# Patient Record
Sex: Female | Born: 1940 | Race: White | Hispanic: No | Marital: Married | Smoking: Never smoker
Health system: Southern US, Community
[De-identification: ages and names within clinical notes are randomized; demographics above are authoritative.]

## PROBLEM LIST (undated history)

## (undated) DIAGNOSIS — E039 Hypothyroidism, unspecified: Secondary | ICD-10-CM

## (undated) DIAGNOSIS — C801 Malignant (primary) neoplasm, unspecified: Secondary | ICD-10-CM

## (undated) DIAGNOSIS — F419 Anxiety disorder, unspecified: Secondary | ICD-10-CM

## (undated) DIAGNOSIS — M1990 Unspecified osteoarthritis, unspecified site: Secondary | ICD-10-CM

## (undated) DIAGNOSIS — Z5189 Encounter for other specified aftercare: Secondary | ICD-10-CM

## (undated) DIAGNOSIS — T7840XA Allergy, unspecified, initial encounter: Secondary | ICD-10-CM

## (undated) DIAGNOSIS — E785 Hyperlipidemia, unspecified: Secondary | ICD-10-CM

## (undated) DIAGNOSIS — D649 Anemia, unspecified: Secondary | ICD-10-CM

## (undated) DIAGNOSIS — K219 Gastro-esophageal reflux disease without esophagitis: Secondary | ICD-10-CM

## (undated) DIAGNOSIS — H269 Unspecified cataract: Secondary | ICD-10-CM

## (undated) HISTORY — DX: Allergy, unspecified, initial encounter: T78.40XA

## (undated) HISTORY — DX: Unspecified osteoarthritis, unspecified site: M19.90

## (undated) HISTORY — PX: JOINT REPLACEMENT: SHX530

## (undated) HISTORY — PX: COLONOSCOPY: SHX174

## (undated) HISTORY — DX: Anemia, unspecified: D64.9

## (undated) HISTORY — DX: Unspecified cataract: H26.9

## (undated) HISTORY — DX: Encounter for other specified aftercare: Z51.89

---
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
## 1980-10-10 HISTORY — PX: TUBAL LIGATION: SHX77

## 1989-10-10 HISTORY — PX: VARICOSE VEIN SURGERY: SHX832

## 1998-10-10 HISTORY — PX: OTHER SURGICAL HISTORY: SHX169

## 2009-10-10 HISTORY — PX: OTHER SURGICAL HISTORY: SHX169

## 2010-10-10 DIAGNOSIS — Z8614 Personal history of Methicillin resistant Staphylococcus aureus infection: Secondary | ICD-10-CM

## 2010-10-10 HISTORY — DX: Personal history of Methicillin resistant Staphylococcus aureus infection: Z86.14

## 2011-10-11 HISTORY — PX: HERNIA REPAIR: SHX51

## 2016-09-22 DIAGNOSIS — E039 Hypothyroidism, unspecified: Secondary | ICD-10-CM | POA: Diagnosis not present

## 2016-09-22 DIAGNOSIS — Z0000 Encounter for general adult medical examination without abnormal findings: Secondary | ICD-10-CM | POA: Diagnosis not present

## 2016-09-26 DIAGNOSIS — R69 Illness, unspecified: Secondary | ICD-10-CM | POA: Diagnosis not present

## 2016-09-26 DIAGNOSIS — Z13820 Encounter for screening for osteoporosis: Secondary | ICD-10-CM | POA: Diagnosis not present

## 2016-10-28 DIAGNOSIS — Z96643 Presence of artificial hip joint, bilateral: Secondary | ICD-10-CM | POA: Diagnosis not present

## 2016-10-28 DIAGNOSIS — E039 Hypothyroidism, unspecified: Secondary | ICD-10-CM | POA: Diagnosis not present

## 2016-10-28 DIAGNOSIS — E7800 Pure hypercholesterolemia, unspecified: Secondary | ICD-10-CM | POA: Diagnosis not present

## 2016-10-28 DIAGNOSIS — M8580 Other specified disorders of bone density and structure, unspecified site: Secondary | ICD-10-CM | POA: Diagnosis not present

## 2016-11-04 DIAGNOSIS — Z780 Asymptomatic menopausal state: Secondary | ICD-10-CM | POA: Diagnosis not present

## 2016-11-04 DIAGNOSIS — Z1231 Encounter for screening mammogram for malignant neoplasm of breast: Secondary | ICD-10-CM | POA: Diagnosis not present

## 2016-12-20 DIAGNOSIS — Z808 Family history of malignant neoplasm of other organs or systems: Secondary | ICD-10-CM | POA: Diagnosis not present

## 2016-12-20 DIAGNOSIS — C44311 Basal cell carcinoma of skin of nose: Secondary | ICD-10-CM | POA: Diagnosis not present

## 2016-12-20 DIAGNOSIS — L820 Inflamed seborrheic keratosis: Secondary | ICD-10-CM | POA: Diagnosis not present

## 2016-12-20 DIAGNOSIS — L821 Other seborrheic keratosis: Secondary | ICD-10-CM | POA: Diagnosis not present

## 2016-12-20 DIAGNOSIS — D485 Neoplasm of uncertain behavior of skin: Secondary | ICD-10-CM | POA: Diagnosis not present

## 2016-12-20 DIAGNOSIS — D1801 Hemangioma of skin and subcutaneous tissue: Secondary | ICD-10-CM | POA: Diagnosis not present

## 2016-12-20 DIAGNOSIS — L718 Other rosacea: Secondary | ICD-10-CM | POA: Diagnosis not present

## 2017-01-26 DIAGNOSIS — S8991XA Unspecified injury of right lower leg, initial encounter: Secondary | ICD-10-CM | POA: Diagnosis not present

## 2017-01-26 DIAGNOSIS — S8001XA Contusion of right knee, initial encounter: Secondary | ICD-10-CM | POA: Diagnosis not present

## 2017-02-07 DIAGNOSIS — L905 Scar conditions and fibrosis of skin: Secondary | ICD-10-CM | POA: Diagnosis not present

## 2017-02-07 DIAGNOSIS — C44311 Basal cell carcinoma of skin of nose: Secondary | ICD-10-CM | POA: Diagnosis not present

## 2017-05-30 DIAGNOSIS — H5203 Hypermetropia, bilateral: Secondary | ICD-10-CM | POA: Diagnosis not present

## 2017-05-30 DIAGNOSIS — H04123 Dry eye syndrome of bilateral lacrimal glands: Secondary | ICD-10-CM | POA: Diagnosis not present

## 2017-07-11 DIAGNOSIS — L821 Other seborrheic keratosis: Secondary | ICD-10-CM | POA: Diagnosis not present

## 2017-07-11 DIAGNOSIS — Z08 Encounter for follow-up examination after completed treatment for malignant neoplasm: Secondary | ICD-10-CM | POA: Diagnosis not present

## 2017-07-11 DIAGNOSIS — C44311 Basal cell carcinoma of skin of nose: Secondary | ICD-10-CM | POA: Diagnosis not present

## 2017-07-11 DIAGNOSIS — D2262 Melanocytic nevi of left upper limb, including shoulder: Secondary | ICD-10-CM | POA: Diagnosis not present

## 2017-07-13 DIAGNOSIS — Z23 Encounter for immunization: Secondary | ICD-10-CM | POA: Diagnosis not present

## 2017-09-25 DIAGNOSIS — E039 Hypothyroidism, unspecified: Secondary | ICD-10-CM | POA: Diagnosis not present

## 2017-09-25 DIAGNOSIS — Z0000 Encounter for general adult medical examination without abnormal findings: Secondary | ICD-10-CM | POA: Diagnosis not present

## 2017-09-27 DIAGNOSIS — R69 Illness, unspecified: Secondary | ICD-10-CM | POA: Diagnosis not present

## 2017-09-27 DIAGNOSIS — Z1231 Encounter for screening mammogram for malignant neoplasm of breast: Secondary | ICD-10-CM | POA: Diagnosis not present

## 2017-09-27 DIAGNOSIS — E7800 Pure hypercholesterolemia, unspecified: Secondary | ICD-10-CM | POA: Diagnosis not present

## 2017-09-27 DIAGNOSIS — M8580 Other specified disorders of bone density and structure, unspecified site: Secondary | ICD-10-CM | POA: Diagnosis not present

## 2017-09-27 DIAGNOSIS — Z96643 Presence of artificial hip joint, bilateral: Secondary | ICD-10-CM | POA: Diagnosis not present

## 2017-09-27 DIAGNOSIS — E039 Hypothyroidism, unspecified: Secondary | ICD-10-CM | POA: Diagnosis not present

## 2017-11-06 DIAGNOSIS — Z1231 Encounter for screening mammogram for malignant neoplasm of breast: Secondary | ICD-10-CM | POA: Diagnosis not present

## 2017-12-15 DIAGNOSIS — Z96643 Presence of artificial hip joint, bilateral: Secondary | ICD-10-CM | POA: Diagnosis not present

## 2017-12-15 DIAGNOSIS — Z471 Aftercare following joint replacement surgery: Secondary | ICD-10-CM | POA: Diagnosis not present

## 2018-01-16 DIAGNOSIS — L718 Other rosacea: Secondary | ICD-10-CM | POA: Diagnosis not present

## 2018-01-16 DIAGNOSIS — D2262 Melanocytic nevi of left upper limb, including shoulder: Secondary | ICD-10-CM | POA: Diagnosis not present

## 2018-01-16 DIAGNOSIS — C44311 Basal cell carcinoma of skin of nose: Secondary | ICD-10-CM | POA: Diagnosis not present

## 2018-01-16 DIAGNOSIS — L570 Actinic keratosis: Secondary | ICD-10-CM | POA: Diagnosis not present

## 2018-01-16 DIAGNOSIS — X32XXXA Exposure to sunlight, initial encounter: Secondary | ICD-10-CM | POA: Diagnosis not present

## 2018-01-16 DIAGNOSIS — L821 Other seborrheic keratosis: Secondary | ICD-10-CM | POA: Diagnosis not present

## 2018-07-09 DIAGNOSIS — H04123 Dry eye syndrome of bilateral lacrimal glands: Secondary | ICD-10-CM | POA: Diagnosis not present

## 2018-07-09 DIAGNOSIS — H2513 Age-related nuclear cataract, bilateral: Secondary | ICD-10-CM | POA: Diagnosis not present

## 2018-07-09 DIAGNOSIS — H43813 Vitreous degeneration, bilateral: Secondary | ICD-10-CM | POA: Diagnosis not present

## 2018-08-03 DIAGNOSIS — Z23 Encounter for immunization: Secondary | ICD-10-CM | POA: Diagnosis not present

## 2018-08-07 DIAGNOSIS — L718 Other rosacea: Secondary | ICD-10-CM | POA: Diagnosis not present

## 2018-08-07 DIAGNOSIS — L821 Other seborrheic keratosis: Secondary | ICD-10-CM | POA: Diagnosis not present

## 2018-08-07 DIAGNOSIS — C44311 Basal cell carcinoma of skin of nose: Secondary | ICD-10-CM | POA: Diagnosis not present

## 2018-08-07 DIAGNOSIS — D1801 Hemangioma of skin and subcutaneous tissue: Secondary | ICD-10-CM | POA: Diagnosis not present

## 2018-10-08 DIAGNOSIS — R200 Anesthesia of skin: Secondary | ICD-10-CM | POA: Diagnosis not present

## 2018-10-08 DIAGNOSIS — E039 Hypothyroidism, unspecified: Secondary | ICD-10-CM | POA: Diagnosis not present

## 2018-10-08 DIAGNOSIS — R252 Cramp and spasm: Secondary | ICD-10-CM | POA: Diagnosis not present

## 2018-10-08 DIAGNOSIS — R69 Illness, unspecified: Secondary | ICD-10-CM | POA: Diagnosis not present

## 2018-10-08 DIAGNOSIS — R202 Paresthesia of skin: Secondary | ICD-10-CM | POA: Diagnosis not present

## 2018-10-08 DIAGNOSIS — E7800 Pure hypercholesterolemia, unspecified: Secondary | ICD-10-CM | POA: Diagnosis not present

## 2018-10-12 DIAGNOSIS — E039 Hypothyroidism, unspecified: Secondary | ICD-10-CM | POA: Diagnosis not present

## 2018-10-12 DIAGNOSIS — R202 Paresthesia of skin: Secondary | ICD-10-CM | POA: Diagnosis not present

## 2018-10-12 DIAGNOSIS — Z96643 Presence of artificial hip joint, bilateral: Secondary | ICD-10-CM | POA: Diagnosis not present

## 2018-10-12 DIAGNOSIS — R200 Anesthesia of skin: Secondary | ICD-10-CM | POA: Diagnosis not present

## 2018-10-12 DIAGNOSIS — R69 Illness, unspecified: Secondary | ICD-10-CM | POA: Diagnosis not present

## 2018-10-12 DIAGNOSIS — E7800 Pure hypercholesterolemia, unspecified: Secondary | ICD-10-CM | POA: Diagnosis not present

## 2018-10-12 DIAGNOSIS — M8580 Other specified disorders of bone density and structure, unspecified site: Secondary | ICD-10-CM | POA: Diagnosis not present

## 2018-10-12 DIAGNOSIS — E538 Deficiency of other specified B group vitamins: Secondary | ICD-10-CM | POA: Diagnosis not present

## 2018-11-07 DIAGNOSIS — Z1231 Encounter for screening mammogram for malignant neoplasm of breast: Secondary | ICD-10-CM | POA: Diagnosis not present

## 2019-06-11 DIAGNOSIS — M810 Age-related osteoporosis without current pathological fracture: Secondary | ICD-10-CM | POA: Diagnosis not present

## 2019-06-11 DIAGNOSIS — E039 Hypothyroidism, unspecified: Secondary | ICD-10-CM | POA: Diagnosis not present

## 2019-06-11 DIAGNOSIS — I83813 Varicose veins of bilateral lower extremities with pain: Secondary | ICD-10-CM | POA: Diagnosis not present

## 2019-06-11 DIAGNOSIS — Z96643 Presence of artificial hip joint, bilateral: Secondary | ICD-10-CM | POA: Diagnosis not present

## 2019-06-11 DIAGNOSIS — R69 Illness, unspecified: Secondary | ICD-10-CM | POA: Diagnosis not present

## 2019-06-11 DIAGNOSIS — E785 Hyperlipidemia, unspecified: Secondary | ICD-10-CM | POA: Diagnosis not present

## 2019-06-11 DIAGNOSIS — E538 Deficiency of other specified B group vitamins: Secondary | ICD-10-CM | POA: Diagnosis not present

## 2019-06-11 DIAGNOSIS — Z23 Encounter for immunization: Secondary | ICD-10-CM | POA: Diagnosis not present

## 2019-06-11 DIAGNOSIS — E559 Vitamin D deficiency, unspecified: Secondary | ICD-10-CM | POA: Diagnosis not present

## 2019-06-18 ENCOUNTER — Other Ambulatory Visit: Payer: Self-pay

## 2019-06-18 ENCOUNTER — Encounter: Payer: Self-pay | Admitting: Internal Medicine

## 2019-06-18 ENCOUNTER — Ambulatory Visit (INDEPENDENT_AMBULATORY_CARE_PROVIDER_SITE_OTHER): Payer: Medicare HMO | Admitting: Internal Medicine

## 2019-06-18 VITALS — BP 122/82 | HR 64 | Temp 97.3°F | Resp 15 | Wt 157.6 lb

## 2019-06-18 DIAGNOSIS — R69 Illness, unspecified: Secondary | ICD-10-CM | POA: Diagnosis not present

## 2019-06-18 DIAGNOSIS — M810 Age-related osteoporosis without current pathological fracture: Secondary | ICD-10-CM

## 2019-06-18 DIAGNOSIS — F409 Phobic anxiety disorder, unspecified: Secondary | ICD-10-CM

## 2019-06-18 DIAGNOSIS — E785 Hyperlipidemia, unspecified: Secondary | ICD-10-CM

## 2019-06-18 DIAGNOSIS — E538 Deficiency of other specified B group vitamins: Secondary | ICD-10-CM

## 2019-06-18 DIAGNOSIS — F5105 Insomnia due to other mental disorder: Secondary | ICD-10-CM

## 2019-06-18 DIAGNOSIS — Z23 Encounter for immunization: Secondary | ICD-10-CM | POA: Diagnosis not present

## 2019-06-18 DIAGNOSIS — F411 Generalized anxiety disorder: Secondary | ICD-10-CM

## 2019-06-18 DIAGNOSIS — I83813 Varicose veins of bilateral lower extremities with pain: Secondary | ICD-10-CM | POA: Diagnosis not present

## 2019-06-18 DIAGNOSIS — F410 Panic disorder [episodic paroxysmal anxiety] without agoraphobia: Secondary | ICD-10-CM

## 2019-06-18 DIAGNOSIS — E559 Vitamin D deficiency, unspecified: Secondary | ICD-10-CM

## 2019-06-18 DIAGNOSIS — E039 Hypothyroidism, unspecified: Secondary | ICD-10-CM | POA: Diagnosis not present

## 2019-06-18 DIAGNOSIS — Z96643 Presence of artificial hip joint, bilateral: Secondary | ICD-10-CM

## 2019-06-18 DIAGNOSIS — Z780 Asymptomatic menopausal state: Secondary | ICD-10-CM

## 2019-06-18 MED ORDER — ZOSTER VAC RECOMB ADJUVANTED 50 MCG/0.5ML IM SUSR: 0.5000 mL | Freq: Once | INTRAMUSCULAR | 1 refills | Status: AC

## 2019-06-18 NOTE — Assessment & Plan Note (Signed)
Secondary to DJD .

## 2019-06-18 NOTE — Assessment & Plan Note (Signed)
Noted in forearm by 2016 DEXA.  There is a family history .  Repeat DEXA planned in 2021

## 2019-06-18 NOTE — Assessment & Plan Note (Signed)
She has had recurrent episodes of pain suggestive of phlebitis.  continue use of thigh high stockings with compression and plan to reevaluate when symptomatic

## 2019-06-18 NOTE — Patient Instructions (Signed)
Pleasure to meet you!     I'll see you again in January 2021  Fasting labs can be done prior to visit

## 2019-06-18 NOTE — Progress Notes (Signed)
Subjective:  Patient ID: Michelle Butler, female    DOB: 08-29-1941  Age: 78 y.o. MRN: [id number]  CC: The primary encounter diagnosis was Acquired hypothyroidism. Diagnoses of B12 deficiency, Vitamin D deficiency, Need for immunization against influenza, Need for 23-polyvalent pneumococcal polysaccharide vaccine, Hyperlipidemia with target LDL less than 130, Generalized anxiety disorder with panic attacks, Insomnia due to anxiety and fear, Status post bilateral total hip replacement, Osteopenia after menopause, Varicose veins of both lower extremities with pain, and Hypothyroidism (acquired) were also pertinent to this visit.  HPI Caydince Brescia presents for establishment of care. Relocated from [place] to [place] with husand to be closer to her children , including her daughter Jeannett Senior .  She and her husband are in good health and live independently.   The patient has no signs or symptoms of COVID 19 infection (fever, cough, sore throat  or shortness of breath beyond what is typical for patient).  Patient denies contact with other persons with the above mentioned symptoms or with anyone confirmed to have COVID 19 .  She has been minimizing her contact with the public and using a mask and hand sanitizer when she comes into any contact with the public.   last colonoscopy normal   5 yr s ago   Sister has osteoporosis  Taking "the yearly shot" (Reclast)    LOst her son 33 yrs ago  at age 65 due due drug overdose, presumed. She found him deceased in his apartmebt,     Anxiety,  Borderline B12,  Dropping things.  Insomnia  Managed with 0.5 mg lorazepam (NEVER A FULL TABLET) AND NOT MORE THAN TWICE A WEEK.   Last TdaP 10 yrs ago Wants flu vaccine high dose 2011 Zoster   Wants shingrx   Mammogram normal in  jan 2020 dexa 2016  Varicose veins, managed  with naproxen and stocking.    History Jenavie has no past medical history on file.   She has a past surgical history that  includes Tubal ligation (1982); Hernia repair (2013); Appendectomy (1959); right hip replacement (2000); left hip replacement (2011); Tonsillectomy (1959); and Varicose vein surgery (1991).   Her family history includes Hip fracture (age of onset: 29) in her father; Melanoma in her brother; Melanoma (age of onset: 69) in her brother; Peripheral Artery Disease (age of onset: 5) in her mother; Pneumonia (age of onset: 73) in her paternal grandfather; Stroke (age of onset: 78) in her paternal grandmother; Stroke (age of onset: 7) in her maternal grandfather.She reports that she has never smoked. She has never used smokeless tobacco. She reports that she does not use drugs. No history on file for alcohol.  Outpatient Medications Prior to Visit  Medication Sig Dispense Refill   carboxymethylcellul-glycerin (REFRESH OPTIVE) 0.5-0.9 % ophthalmic solution 1 drop.     levothyroxine (SYNTHROID) 100 MCG tablet Take 100 mcg by mouth daily.     LORazepam (ATIVAN) 1 MG tablet TAKE 1 TABLET BY MOUTH AT BEDTIME AS NEEDED FOR ANXIETY     metroNIDAZOLE (METROCREAM) 0.75 % cream Apply 1 application topically 2 (two) times daily.     Multiple Vitamin (MULTIVITAMIN) tablet Take 1 tablet by mouth daily.     naproxen sodium (ALEVE) 220 MG tablet Take 220 mg by mouth.     PARoxetine (PAXIL) 10 MG tablet TAKE 1 TABLET BY MOUTH EVERY DAY IN THE MORNING     No facility-administered medications prior to visit.     Review of Systems:  Patient denies  headache, fevers, malaise, unintentional weight loss, skin rash, eye pain, sinus congestion and sinus pain, sore throat, dysphagia,  hemoptysis , cough, dyspnea, wheezing, chest pain, palpitations, orthopnea, edema, abdominal pain, nausea, melena, diarrhea, constipation, flank pain, dysuria, hematuria, urinary  Frequency, nocturia, numbness, tingling, seizures,  Focal weakness, Loss of consciousness,  Tremor, insomnia, depression, anxiety, and suicidal ideation.      Objective:  BP 122/82 (BP Location: Left Arm, Patient Position: Sitting, Cuff Size: Normal)    Pulse 64    Temp (!) 97.3 F (36.3 C) (Oral)    Resp 15    Wt 157 lb 9.6 oz (71.5 kg)    SpO2 99%   Physical Exam:  General appearance: alert, cooperative and appears stated age Ears: normal TM's and external ear canals both ears Throat: lips, mucosa, and tongue normal; teeth and gums normal Neck: no adenopathy, no carotid bruit, supple, symmetrical, trachea midline and thyroid not enlarged, symmetric, no tenderness/mass/nodules Back: symmetric, no curvature. ROM normal. No CVA tenderness. Lungs: clear to auscultation bilaterally Heart: regular rate and rhythm, S1, S2 normal, no murmur, click, rub or gallop Abdomen: soft, non-tender; bowel sounds normal; no masses,  no organomegaly Pulses: 2+ and symmetric Skin: Skin color, texture, turgor normal. No rashes or lesions Lymph nodes: Cervical, supraclavicular, and axillary nodes normal.   Assessment & Plan:   Problem List Items Addressed This Visit      Unprioritized   Hyperlipidemia with target LDL less than 130    Reviewed her previous fasting lipid panel done by former PCP in Jan 2020. Using the  [facility] , her 10 yr risk of atherosclerotic events is  7% .      Generalized anxiety disorder with panic attacks    Present for over  Decade, managed with paroxetine. Reassurance provided that there are no serious long term consequences of SSRI use       Relevant Medications   LORazepam (ATIVAN) 1 MG tablet   PARoxetine (PAXIL) 10 MG tablet   Insomnia due to anxiety and fear    She uses lorazepam  no more than twice weekly . The risks and benefits of  Chronic  benzodiazepine use were discussed with patient today including increased risk of dementia,  Addiction, and seizures if abruptly withdrawn  .       Status post bilateral total hip replacement    Secondary to DJD .       Osteopenia after menopause    Noted in forearm by 2016 DEXA.   There is a family history .  Repeat DEXA planned in 2021       B12 deficiency    Level was < 300,  MMA was  Normal.        Relevant Orders   Vitamin B12   Intrinsic Factor Antibodies   CBC with Differential/Platelet   Varicose veins of both lower extremities with pain    She has had recurrent episodes of pain suggestive of phlebitis.  continue use of thigh high stockings with compression and plan to reevaluate when symptomatic       Hypothyroidism (acquired)    Managed with levothyroxine,  No recent dose changes.      Relevant Medications   levothyroxine (SYNTHROID) 100 MCG tablet    Other Visit Diagnoses    Acquired hypothyroidism    -  Primary   Relevant Medications   levothyroxine (SYNTHROID) 100 MCG tablet   Other Relevant Orders   Comprehensive metabolic panel   TSH   Vitamin  D deficiency       Relevant Orders   VITAMIN D 25 Hydroxy (Vit-D Deficiency, Fractures)   Need for immunization against influenza       Relevant Orders   Flu Vaccine QUAD High Dose(Fluad) (Completed)   Need for 23-polyvalent pneumococcal polysaccharide vaccine       Relevant Orders   Pneumococcal polysaccharide vaccine 23-valent greater than or equal to 2yo subcutaneous/IM     A total of 45 minutes was spent with patient more than half of which was spent in counseling patient on the above mentioned issues , reviewing and explaining recent labs and imaging studies done, and coordination of care. I am having Michelle Butler start on Zoster Vaccine Adjuvanted. I am also having her maintain her levothyroxine, LORazepam, PARoxetine, metroNIDAZOLE, carboxymethylcellul-glycerin, naproxen sodium, and multivitamin.  Meds ordered this encounter  Medications   Zoster Vaccine Adjuvanted [facility]) injection    Sig: Inject 0.5 mLs into the muscle once for 1 dose.    Dispense:  1 each    Refill:  1    There are no discontinued medications.  Follow-up: No follow-ups on file.   Crecencio Mc, MD

## 2019-06-18 NOTE — Assessment & Plan Note (Signed)
She uses lorazepam  no more than twice weekly . The risks and benefits of  Chronic  benzodiazepine use were discussed with patient today including increased risk of dementia,  Addiction, and seizures if abruptly withdrawn  .  

## 2019-06-18 NOTE — Assessment & Plan Note (Signed)
Reviewed her previous fasting lipid panel done by former PCP in Jan 2020. Using the  [facility] , her 10 yr risk of atherosclerotic events is  7% .

## 2019-06-18 NOTE — Assessment & Plan Note (Signed)
Managed with levothyroxine,  No recent dose changes.

## 2019-06-18 NOTE — Assessment & Plan Note (Signed)
Present for over  Decade, managed with paroxetine. Reassurance provided that there are no serious long term consequences of SSRI use

## 2019-06-18 NOTE — Assessment & Plan Note (Signed)
Level was < 300,  MMA was  Normal.

## 2019-07-16 DIAGNOSIS — H524 Presbyopia: Secondary | ICD-10-CM | POA: Diagnosis not present

## 2019-07-16 DIAGNOSIS — Z135 Encounter for screening for eye and ear disorders: Secondary | ICD-10-CM | POA: Diagnosis not present

## 2019-08-08 DIAGNOSIS — D2261 Melanocytic nevi of right upper limb, including shoulder: Secondary | ICD-10-CM | POA: Diagnosis not present

## 2019-08-08 DIAGNOSIS — D2271 Melanocytic nevi of right lower limb, including hip: Secondary | ICD-10-CM | POA: Diagnosis not present

## 2019-08-08 DIAGNOSIS — D2262 Melanocytic nevi of left upper limb, including shoulder: Secondary | ICD-10-CM | POA: Diagnosis not present

## 2019-08-08 DIAGNOSIS — L718 Other rosacea: Secondary | ICD-10-CM | POA: Diagnosis not present

## 2019-08-08 DIAGNOSIS — Z85828 Personal history of other malignant neoplasm of skin: Secondary | ICD-10-CM | POA: Diagnosis not present

## 2019-08-08 DIAGNOSIS — Z08 Encounter for follow-up examination after completed treatment for malignant neoplasm: Secondary | ICD-10-CM | POA: Diagnosis not present

## 2019-10-17 ENCOUNTER — Other Ambulatory Visit: Payer: Self-pay

## 2019-10-22 ENCOUNTER — Other Ambulatory Visit: Payer: Self-pay

## 2019-10-22 ENCOUNTER — Other Ambulatory Visit (INDEPENDENT_AMBULATORY_CARE_PROVIDER_SITE_OTHER): Payer: Medicare HMO

## 2019-10-22 DIAGNOSIS — E559 Vitamin D deficiency, unspecified: Secondary | ICD-10-CM | POA: Diagnosis not present

## 2019-10-22 DIAGNOSIS — E538 Deficiency of other specified B group vitamins: Secondary | ICD-10-CM | POA: Diagnosis not present

## 2019-10-22 DIAGNOSIS — E039 Hypothyroidism, unspecified: Secondary | ICD-10-CM

## 2019-10-22 LAB — COMPREHENSIVE METABOLIC PANEL
ALT: 13 U/L (ref 0–35)
AST: 20 U/L (ref 0–37)
Albumin: 4.2 g/dL (ref 3.5–5.2)
Alkaline Phosphatase: 42 U/L (ref 39–117)
BUN: 14 mg/dL (ref 6–23)
CO2: 29 mEq/L (ref 19–32)
Calcium: 9.4 mg/dL (ref 8.4–10.5)
Chloride: 104 mEq/L (ref 96–112)
Creatinine, Ser: 0.84 mg/dL (ref 0.40–1.20)
GFR: 65.4 mL/min (ref >60.00)
Glucose, Bld: 87 mg/dL (ref 70–99)
Potassium: 4.6 mEq/L (ref 3.5–5.1)
Sodium: 138 mEq/L (ref 135–145)
Total Bilirubin: 0.5 mg/dL (ref 0.2–1.2)
Total Protein: 7 g/dL (ref 6.0–8.3)

## 2019-10-22 LAB — TSH: TSH: 1.62 u[IU]/mL (ref 0.35–4.50)

## 2019-10-22 LAB — CBC WITH DIFFERENTIAL/PLATELET
Basophils Absolute: 0 10*3/uL (ref 0.0–0.1)
Basophils Relative: 0.7 % (ref 0.0–3.0)
Eosinophils Absolute: 0.1 10*3/uL (ref 0.0–0.7)
Eosinophils Relative: 1.1 % (ref 0.0–5.0)
HCT: 40.2 % (ref 36.0–46.0)
Hemoglobin: 13.4 g/dL (ref 12.0–15.0)
Lymphocytes Relative: 32.1 % (ref 12.0–46.0)
Lymphs Abs: 1.7 10*3/uL (ref 0.7–4.0)
MCHC: 33.3 g/dL (ref 30.0–36.0)
MCV: 95.4 fl (ref 78.0–100.0)
Monocytes Absolute: 0.6 10*3/uL (ref 0.1–1.0)
Monocytes Relative: 12 % (ref 3.0–12.0)
Neutro Abs: 2.8 10*3/uL (ref 1.4–7.7)
Neutrophils Relative %: 54.1 % (ref 43.0–77.0)
Platelets: 147 10*3/uL — ABNORMAL LOW (ref 150.0–400.0)
RBC: 4.22 Mil/uL (ref 3.87–5.11)
RDW: 13.8 % (ref 11.5–15.5)
WBC: 5.2 10*3/uL (ref 4.0–10.5)

## 2019-10-22 LAB — VITAMIN B12: Vitamin B-12: 301 pg/mL (ref 211–911)

## 2019-10-22 LAB — VITAMIN D 25 HYDROXY (VIT D DEFICIENCY, FRACTURES): VITD: 28.8 ng/mL — ABNORMAL LOW (ref 30.00–100.00)

## 2019-10-24 ENCOUNTER — Other Ambulatory Visit: Payer: Self-pay

## 2019-10-24 ENCOUNTER — Ambulatory Visit (INDEPENDENT_AMBULATORY_CARE_PROVIDER_SITE_OTHER): Payer: Medicare HMO | Admitting: Internal Medicine

## 2019-10-24 ENCOUNTER — Encounter: Payer: Self-pay | Admitting: Internal Medicine

## 2019-10-24 VITALS — BP 115/65 | HR 62 | Ht 63.0 in | Wt 155.0 lb

## 2019-10-24 DIAGNOSIS — E785 Hyperlipidemia, unspecified: Secondary | ICD-10-CM | POA: Diagnosis not present

## 2019-10-24 DIAGNOSIS — Z1231 Encounter for screening mammogram for malignant neoplasm of breast: Secondary | ICD-10-CM | POA: Diagnosis not present

## 2019-10-24 DIAGNOSIS — E538 Deficiency of other specified B group vitamins: Secondary | ICD-10-CM

## 2019-10-24 DIAGNOSIS — Z1211 Encounter for screening for malignant neoplasm of colon: Secondary | ICD-10-CM | POA: Diagnosis not present

## 2019-10-24 DIAGNOSIS — E559 Vitamin D deficiency, unspecified: Secondary | ICD-10-CM | POA: Diagnosis not present

## 2019-10-24 DIAGNOSIS — Z0000 Encounter for general adult medical examination without abnormal findings: Secondary | ICD-10-CM

## 2019-10-24 DIAGNOSIS — M19042 Primary osteoarthritis, left hand: Secondary | ICD-10-CM | POA: Diagnosis not present

## 2019-10-24 DIAGNOSIS — M19041 Primary osteoarthritis, right hand: Secondary | ICD-10-CM

## 2019-10-24 DIAGNOSIS — E039 Hypothyroidism, unspecified: Secondary | ICD-10-CM | POA: Diagnosis not present

## 2019-10-24 LAB — INTRINSIC FACTOR ANTIBODIES: Intrinsic Factor: NEGATIVE

## 2019-10-24 MED ORDER — LEVOTHYROXINE SODIUM 100 MCG PO TABS: 100.0000 ug | ORAL_TABLET | Freq: Every day | ORAL | 1 refills | Status: DC

## 2019-10-24 MED ORDER — PAROXETINE HCL 10 MG PO TABS: ORAL_TABLET | ORAL | 1 refills | Status: DC

## 2019-10-24 MED ORDER — LORAZEPAM 1 MG PO TABS: ORAL_TABLET | ORAL | 5 refills | Status: DC

## 2019-10-24 NOTE — Progress Notes (Signed)
Teleohone note   This visit type was conducted due to national recommendations for restrictions regarding the COVID-19 pandemic (e.g. social distancing).  This format is felt to be most appropriate for this patient at this time.  All issues noted in this document were discussed and addressed.  No physical exam was performed (except for noted visual exam findings with Video Visits).   I connected with@ on 10/24/19 at  8:00 AM EST  By telephone and verified that I am speaking with the correct person using two identifiers. Location patient: home Location provider: work or home office Persons participating in the virtual visit: patient, provider  I discussed the limitations, risks, security and privacy concerns of performing an evaluation and management service by telephone and the availability of in person appointments. I also discussed with the patient that there may be a patient responsible charge related to this service. The patient expressed understanding and agreed to proceed. Reason for visit: follow up   The risk factors are reflected in the social history.  The roster of all physicians providing medical care to patient - is listed in the Snapshot section of the chart.  Activities of daily living:  The patient is 100% independent in all ADLs: dressing, toileting, feeding as well as independent mobility  Home safety : The patient has smoke detectors in the home. They wear seatbelts.  There are no firearms at home. There is no violence in the home.   There is no risks for hepatitis, STDs or HIV. There is no   history of blood transfusion. They have no travel history to infectious disease endemic areas of the world.  The patient has seen their dentist in the last six month. They have seen their eye doctor in the last year. They admit to slight hearing difficulty with regard to whispered voices and some television programs.  They have deferred audiologic testing in the last year.  They do not   have excessive sun exposure. Discussed the need for sun protection: hats, long sleeves and use of sunscreen if there is significant sun exposure.   Diet: the importance of a healthy diet is discussed. They do have a healthy diet.  The benefits of regular aerobic exercise were discussed. She walks 4 times per week ,  20 minutes.   Depression screen: there are no signs or vegative symptoms of depression- irritability, change in appetite, anhedonia, sadness/tearfullness.  Cognitive assessment: the patient manages all their financial and personal affairs and is actively engaged. They could relate day,date,year and events; recalled 2/3 objects at 3 minutes; performed clock-face test normally.  The following portions of the patient's history were reviewed and updated as appropriate: allergies, current medications, past family history, past medical history,  past surgical history, past social history  and problem list.  Visual acuity was not assessed per patient preference since she has regular follow up with her ophthalmologist. Hearing and body mass index were assessed and reviewed.   During the course of the visit the patient was educated and counseled about appropriate screening and preventive services including : fall prevention , diabetes screening, nutrition counseling, colorectal cancer screening, and recommended immunizations.    CC: The primary encounter diagnosis was Breast cancer screening by mammogram. Diagnoses of Hyperlipidemia with target LDL less than 130, Hypothyroidism (acquired), B12 deficiency, Osteoarthritis of both hands, unspecified osteoarthritis type, Encounter for preventive health examination, and Colon cancer screening were also pertinent to this visit.  79 yr old female with hypothyroidism and anxiety here for  follow up    She feels generally well except for  OA involving hands bothering her more often,  Worse on the right,  Takes  Aleve not more than 2 days in a row.    Wants to postpone mammogram until march after second moderna vaccination   Last [facility]  [place]    ROS: See pertinent positives and negatives per HPI.  History reviewed. No pertinent past medical history.  Past Surgical History:  Procedure Laterality Date  . APPENDECTOMY  1959  . HERNIA REPAIR  2013   Right side with mesh  . left hip replacement  2011  . right hip replacement  2000  . TONSILLECTOMY  1959  . TUBAL LIGATION  1982  . VARICOSE VEIN SURGERY  1991    Family History  Problem Relation Age of Onset  . Peripheral Artery Disease Mother 12  . Hip fracture Father 95  . Melanoma Brother 23  . Stroke Maternal Grandfather 32  . Stroke Paternal Grandmother 43  . Pneumonia Paternal Grandfather 50  . Melanoma Brother     SOCIAL HX:  reports that she has never smoked. She has never used smokeless tobacco. She reports that she does not use drugs. No history on file for alcohol.   Current Outpatient Medications:  .  carboxymethylcellul-glycerin (REFRESH OPTIVE) 0.5-0.9 % ophthalmic solution, 1 drop., Disp: , Rfl:  .  levothyroxine (SYNTHROID) 100 MCG tablet, Take 1 tablet (100 mcg total) by mouth daily., Disp: 90 tablet, Rfl: 1 .  LORazepam (ATIVAN) 1 MG tablet, TAKE 1 TABLET BY MOUTH AT BEDTIME AS NEEDED FOR ANXIETY, Disp: 30 tablet, Rfl: 5 .  metroNIDAZOLE (METROCREAM) 0.75 % cream, Apply 1 application topically 2 (two) times daily., Disp: , Rfl:  .  Multiple Vitamin (MULTIVITAMIN) tablet, Take 1 tablet by mouth daily., Disp: , Rfl:  .  naproxen sodium (ALEVE) 220 MG tablet, Take 220 mg by mouth., Disp: , Rfl:  .  PARoxetine (PAXIL) 10 MG tablet, TAKE 1 TABLET BY MOUTH EVERY DAY IN THE MORNING, Disp: 90 tablet, Rfl: 1  EXAM:   General impression: alert, cooperative and articulate.  No signs of being in distress  Lungs: speech is fluent sentence length suggests that patient is not short of breath and not punctuated  by cough, sneezing or sniffing. Marland Kitchen   Psych: affect normal.  speech is articulate and non pressured .  Denies suicidal thoughts   ASSESSMENT AND PLAN:  Discussed the following assessment and plan:  Breast cancer screening by mammogram - Plan: 3d mammogram [facility]  Hyperlipidemia with target LDL less than 130 - Plan: Lipid panel  Hypothyroidism (acquired) - Plan: TSH  B12 deficiency  Osteoarthritis of both hands, unspecified osteoarthritis type  Encounter for preventive health examination  Colon cancer screening - Plan: Ambulatory referral to Gastroenterology      I discussed the assessment and treatment plan with the patient. The patient was provided an opportunity to ask questions and all were answered. The patient agreed with the plan and demonstrated an understanding of the instructions.   The patient was advised to call back or seek an in-person evaluation if the symptoms worsen or if the condition fails to improve as anticipated.   Crecencio Mc, MD

## 2019-10-24 NOTE — Patient Instructions (Signed)
Your annual mammogram has been ordered.  You are encouraged (required) to call to make your appointment at [facility]  We will try to add lipids to current lab draw.  If no possible we'll do in 6 months

## 2019-10-26 ENCOUNTER — Encounter: Payer: Self-pay | Admitting: Internal Medicine

## 2019-10-26 DIAGNOSIS — Z0000 Encounter for general adult medical examination without abnormal findings: Secondary | ICD-10-CM | POA: Insufficient documentation

## 2019-10-26 DIAGNOSIS — E559 Vitamin D deficiency, unspecified: Secondary | ICD-10-CM | POA: Insufficient documentation

## 2019-10-26 DIAGNOSIS — M19041 Primary osteoarthritis, right hand: Secondary | ICD-10-CM | POA: Insufficient documentation

## 2019-10-26 NOTE — Assessment & Plan Note (Signed)
Advised to increase daily dose to 2000 Ius of D3 daily

## 2019-10-26 NOTE — Assessment & Plan Note (Signed)
Reviewed her previous fasting lipid panel done by former PCP in Jan 2020. Using the  [facility] , her 10 yr risk of atherosclerotic events is  7% .

## 2019-10-26 NOTE — Assessment & Plan Note (Addendum)
Managed with levothyroxine,  No recent dose changes..  TSH is  At goal .  Lab Results  Component Value Date   TSH 1.62 10/22/2019     4

## 2019-10-26 NOTE — Assessment & Plan Note (Signed)
Level was is > 300,  MMA was  Normal.   Lab Results  Component Value Date   VITAMINB12 301 10/22/2019

## 2019-10-26 NOTE — Assessment & Plan Note (Signed)
Managed with prn use of aleve.  No loss of function

## 2019-10-26 NOTE — Assessment & Plan Note (Signed)

## 2019-10-29 DIAGNOSIS — Z1211 Encounter for screening for malignant neoplasm of colon: Secondary | ICD-10-CM

## 2019-11-07 ENCOUNTER — Telehealth: Payer: Self-pay | Admitting: Internal Medicine

## 2019-11-07 ENCOUNTER — Other Ambulatory Visit: Payer: Self-pay | Admitting: Internal Medicine

## 2019-11-07 ENCOUNTER — Other Ambulatory Visit: Payer: Self-pay

## 2019-11-07 ENCOUNTER — Encounter: Payer: Self-pay | Admitting: Internal Medicine

## 2019-11-07 ENCOUNTER — Telehealth: Payer: Self-pay

## 2019-11-07 ENCOUNTER — Ambulatory Visit
Admission: RE | Admit: 2019-11-07 | Discharge: 2019-11-07 | Disposition: A | Discharge status: Home / Self Care | Payer: Medicare HMO | Source: Ambulatory Visit | Attending: Internal Medicine | Admitting: Internal Medicine

## 2019-11-07 ENCOUNTER — Ambulatory Visit (INDEPENDENT_AMBULATORY_CARE_PROVIDER_SITE_OTHER): Payer: Medicare HMO | Admitting: Internal Medicine

## 2019-11-07 VITALS — BP 115/65 | Ht 63.0 in | Wt 157.0 lb

## 2019-11-07 DIAGNOSIS — M25561 Pain in right knee: Secondary | ICD-10-CM

## 2019-11-07 NOTE — Telephone Encounter (Signed)
Pt daughter Jeannett Senior called waiting to talk about her mothers appt this morning  Please call Kiki at [phone]

## 2019-11-07 NOTE — Progress Notes (Signed)
Virtual Visit via Video Note  I connected with Michelle Butler   on 11/07/19 at  9:50 AM EST by a video enabled telemedicine application and verified that I am speaking with the correct person using two identifiers.  Location patient: home Location provider:work or home office Persons participating in the virtual visit: patient, provider, pts husband  I discussed the limitations of evaluation and management by telemedicine and the availability of in person appointments. The patient expressed understanding and agreed to proceed.   HPI: 1. Yesterday 11/06/19 walking with husband and felt like rubber band snaped in right knee and having right lateral knee pain. She did not twist. Slightly increased in size of knee and mild to moderate pain right lateral knee ROM ok but knee gets stiff in 1 positions and bending hurts right knee pain radiates downward to lower leg. She has iced, tried aleve, tylenol elevation does not have knee brace or ace wrap. Xray of right knee per husband was 10 years ago    ROS: See pertinent positives and negatives per HPI.  No past medical history on file.  Past Surgical History:  Procedure Laterality Date  . APPENDECTOMY  1959  . HERNIA REPAIR  2013   Right side with mesh  . left hip replacement  2011  . right hip replacement  2000  . TONSILLECTOMY  1959  . TUBAL LIGATION  1982  . VARICOSE VEIN SURGERY  1991    Family History  Problem Relation Age of Onset  . Peripheral Artery Disease Mother 26  . Hip fracture Father 95  . Melanoma Brother 76  . Stroke Maternal Grandfather 55  . Stroke Paternal Grandmother 100  . Pneumonia Paternal Grandfather 58  . Melanoma Brother     SOCIAL HX:  Married     Current Outpatient Medications:  .  carboxymethylcellul-glycerin (REFRESH OPTIVE) 0.5-0.9 % ophthalmic solution, 1 drop., Disp: , Rfl:  .  levothyroxine (SYNTHROID) 100 MCG tablet, Take 1 tablet (100 mcg total) by mouth daily., Disp: 90 tablet, Rfl: 1 .   LORazepam (ATIVAN) 1 MG tablet, TAKE 1 TABLET BY MOUTH AT BEDTIME AS NEEDED FOR ANXIETY, Disp: 30 tablet, Rfl: 5 .  metroNIDAZOLE (METROCREAM) 0.75 % cream, Apply 1 application topically 2 (two) times daily., Disp: , Rfl:  .  Multiple Vitamin (MULTIVITAMIN) tablet, Take 1 tablet by mouth daily., Disp: , Rfl:  .  naproxen sodium (ALEVE) 220 MG tablet, Take 220 mg by mouth., Disp: , Rfl:  .  PARoxetine (PAXIL) 10 MG tablet, TAKE 1 TABLET BY MOUTH EVERY DAY IN THE MORNING, Disp: 90 tablet, Rfl: 1  EXAM:  VITALS per patient if applicable:  GENERAL: alert, oriented, appears well and in no acute distress  HEENT: atraumatic, conjunttiva clear, no obvious abnormalities on inspection of external nose and ears  NECK: normal movements of the head and neck  LUNGS: on inspection no signs of respiratory distress, breathing rate appears normal, no obvious gross SOB, gasping or wheezing  CV: no obvious cyanosis  MS: moves all visible extremities without noticeable abnormality Right knee slightly more swollen than left   PSYCH/NEURO: pleasant and cooperative, no obvious depression or anxiety, speech and thought processing grossly intact  ASSESSMENT AND PLAN:  Discussed the following assessment and plan:  Acute pain of right knee - Plan: DG Knee Complete 4 Views Right Likely ligament meniscal issue with arthritis  Consider ortho Dr. Melbourne Abts in future  RICE and prn aleve, tylenol voltaren gel otc   -we discussed possible  serious and likely etiologies, options for evaluation and workup, limitations of telemedicine visit vs in person visit, treatment, treatment risks and precautions. Pt prefers to treat via telemedicine empirically rather then risking or undertaking an in person visit at this moment. Patient agrees to seek prompt in person care if worsening, new symptoms arise, or if is not improving with treatment.   I discussed the assessment and treatment plan with the patient. The patient  was provided an opportunity to ask questions and all were answered. The patient agreed with the plan and demonstrated an understanding of the instructions.   The patient was advised to call back or seek an in-person evaluation if the symptoms worsen or if the condition fails to improve as anticipated.  Time spent 20 minutes  Delorise Jackson, MD

## 2019-11-07 NOTE — Telephone Encounter (Signed)
Patient changed her mind and wanted to go seeDr.Frank Olucio at [facility] in  instead of Dr Marry Guan.

## 2019-11-07 NOTE — Telephone Encounter (Signed)
Please see result note 

## 2019-11-18 LAB — COLOGUARD: Cologuard: NEGATIVE

## 2019-11-22 ENCOUNTER — Telehealth: Payer: Self-pay | Admitting: Internal Medicine

## 2019-11-22 NOTE — Telephone Encounter (Signed)
The results of patient's cologuard is negative.   We will repeat every 3 years For colon CA screening. Please abstract and notify patient

## 2019-12-03 ENCOUNTER — Encounter: Payer: Self-pay | Admitting: Internal Medicine

## 2019-12-03 NOTE — Progress Notes (Signed)
Error

## 2019-12-16 ENCOUNTER — Ambulatory Visit (INDEPENDENT_AMBULATORY_CARE_PROVIDER_SITE_OTHER): Payer: Medicare HMO

## 2019-12-16 ENCOUNTER — Other Ambulatory Visit: Payer: Self-pay

## 2019-12-16 VITALS — Ht 63.0 in | Wt 157.0 lb

## 2019-12-16 DIAGNOSIS — Z0000 Encounter for general adult medical examination without abnormal findings: Secondary | ICD-10-CM

## 2019-12-16 NOTE — Progress Notes (Addendum)
Subjective:   Michelle Butler is a 79 y.o. female who presents for an Initial Medicare Annual Wellness Visit.  Review of Systems    No ROS.  Medicare Wellness Virtual Visit.  Visual/audio telehealth visit, UTA vital signs.   Ht/Wt provided.  See social history for additional risk factors.  Cardiac Risk Factors include: advanced age (>19men, >74 women)     Objective:    Today's Vitals   12/16/19 1234  Weight: 157 lb (71.2 kg)  Height: 5\' 3"  (1.6 m)   Body mass index is 27.81 kg/m.  Advanced Directives 12/16/2019  Does Patient Have a Medical Advance Directive? Yes  Type of Paramedic of [place];Living will  Does patient want to make changes to medical advance directive? No - Patient declined  Copy of [place] in Chart? No - copy requested    Current Medications (verified) Outpatient Encounter Medications as of 12/16/2019  Medication Sig  . carboxymethylcellul-glycerin (REFRESH OPTIVE) 0.5-0.9 % ophthalmic solution 1 drop.  Marland Kitchen levothyroxine (SYNTHROID) 100 MCG tablet Take 1 tablet (100 mcg total) by mouth daily.  Marland Kitchen LORazepam (ATIVAN) 1 MG tablet TAKE 1 TABLET BY MOUTH AT BEDTIME AS NEEDED FOR ANXIETY  . metroNIDAZOLE (METROCREAM) 0.75 % cream Apply 1 application topically 2 (two) times daily.  . Multiple Vitamin (MULTIVITAMIN) tablet Take 1 tablet by mouth daily.  . naproxen sodium (ALEVE) 220 MG tablet Take 220 mg by mouth.  Marland Kitchen PARoxetine (PAXIL) 10 MG tablet TAKE 1 TABLET BY MOUTH EVERY DAY IN THE MORNING   No facility-administered encounter medications on file as of 12/16/2019.    Allergies (verified) Citalopram and Duloxetine   History: History reviewed. No pertinent past medical history. Past Surgical History:  Procedure Laterality Date  . APPENDECTOMY  1959  . HERNIA REPAIR  2013   Right side with mesh  . left hip replacement  2011  . right hip replacement  2000  . TONSILLECTOMY  1959  . TUBAL LIGATION  1982  .  VARICOSE VEIN SURGERY  1991   Family History  Problem Relation Age of Onset  . Peripheral Artery Disease Mother 19  . Hip fracture Father 95  . Melanoma Brother 65  . Stroke Maternal Grandfather 56  . Stroke Paternal Grandmother 57  . Pneumonia Paternal Grandfather 80  . Melanoma Brother    Social History   Socioeconomic History  . Marital status: Married    Spouse name: Not on file  . Number of children: Not on file  . Years of education: Not on file  . Highest education level: Not on file  Occupational History  . Not on file  Tobacco Use  . Smoking status: Never Smoker  . Smokeless tobacco: Never Used  Substance and Sexual Activity  . Alcohol use: Yes    Alcohol/week: 2.0 standard drinks    Types: 2 Glasses of wine per week  . Drug use: Never  . Sexual activity: Not on file  Other Topics Concern  . Not on file  Social History Narrative   Married    [place]   Social Determinants of Health   Financial Resource Strain:   . Difficulty of Paying Living Expenses: Not on file  Food Insecurity:   . Worried About Charity fundraiser in the Last Year: Not on file  . Ran Out of Food in the Last Year: Not on file  Transportation Needs:   . Lack of Transportation (Medical): Not on file  .  Lack of Transportation (Non-Medical): Not on file  Physical Activity:   . Days of Exercise per Week: Not on file  . Minutes of Exercise per Session: Not on file  Stress:   . Feeling of Stress : Not on file  Social Connections:   . Frequency of Communication with Friends and Family: Not on file  . Frequency of Social Gatherings with Friends and Family: Not on file  . Attends Religious Services: Not on file  . Active Member of Clubs or Organizations: Not on file  . Attends Archivist Meetings: Not on file  . Marital Status: Not on file    Tobacco Counseling Counseling given: Not Answered   Clinical Intake:  Pre-visit preparation completed: Yes         Diabetes: No  How often do you need to have someone help you when you read instructions, pamphlets, or other written materials from your doctor or pharmacy?: 1 - Never  Interpreter Needed?: No      Activities of Daily Living In your present state of health, do you have any difficulty performing the following activities: 12/16/2019  Hearing? N  Vision? N  Difficulty concentrating or making decisions? N  Walking or climbing stairs? N  Dressing or bathing? N  Doing errands, shopping? N  Preparing Food and eating ? N  Using the Toilet? N  In the past six months, have you accidently leaked urine? N  Do you have problems with loss of bowel control? N  Managing your Medications? N  Managing your Finances? N  Housekeeping or managing your Housekeeping? N  Some recent data might be hidden     Immunizations and Health Maintenance Immunization History  Administered Date(s) Administered  . Fluad Quad(high Dose 65+) 06/18/2019  . Hepatitis A 06/10/2005  . Moderna SARS-COVID-2 Vaccination 10/22/2019  . Pneumococcal Conjugate-13 07/10/2014  . Pneumococcal Polysaccharide-23 07/10/2002, 08/25/2008, 06/11/2019  . Tdap 08/15/2008  . Zoster 01/14/2010  . Zoster Recombinat (Shingrix) 07/10/2019, 09/23/2019   Health Maintenance Due  Topic Date Due  . DEXA SCAN  10/16/2005  . TETANUS/TDAP  08/15/2018    Patient Care Team: Crecencio Mc, MD as PCP - General (Internal Medicine)  Indicate any recent Medical Services you may have received from other than [facility] providers in the past year (date may be approximate).     Assessment:   This is a routine wellness examination for [facility].  Nurse connected with patient 12/16/19 at 12:30 PM EST by a telephone enabled telemedicine application and verified that I am speaking with the correct person using two identifiers. Patient stated full name and DOB. Patient gave permission to continue with virtual visit. Patient's location was at home and  Nurse's location was at [place] office.   Patient is alert and oriented x3. Patient denies difficulty focusing or concentrating. Patient likes to read and completes puzzles for brain stimulation.   Health Maintenance Due: -Tdap vaccine- discussed; to be completed with doctor in visit or local pharmacy.   -Dexa Scan- plans to schedule later in the season. See completed HM at the end of note.   Eye: Visual acuity not assessed. Virtual visit. Followed by their ophthalmologist.  Dental: Visits every 6 months.    Hearing: Demonstrates normal hearing during visit.  Safety:  Patient feels safe at home- yes Patient does have smoke detectors at home- yes Patient does wear sunscreen or protective clothing when in direct sunlight - yes Patient does wear seat belt when in a moving vehicle -  yes Patient drives- yes Adequate lighting in walkways free from debris- yes Grab bars and handrails used as appropriate- yes Ambulates with an assistive device- no Cell phone on person when ambulating outside of the home- yes  Social: Alcohol intake - yes      Smoking history- never   Smokers in home? none Illicit drug use? none  Medication: Taking as directed and without issues.  Self managed - yes   Covid-19: Precautions and sickness symptoms discussed. Wears mask, social distancing, hand hygiene as appropriate.   Activities of Daily Living Patient denies needing assistance with: household chores, feeding themselves, getting from bed to chair, getting to the toilet, bathing/showering, dressing, managing money, or preparing meals.   Discussed the importance of a healthy diet, water intake and the benefits of aerobic exercise.   Physical activity- walking 2 miles daily, strength/weight training   Diet:  Regular Water: good intake Caffeine: 2 -3 cups coffee  Other Providers Patient Care Team: Crecencio Mc, MD as PCP - General (Internal Medicine)  Hearing/Vision screen  Hearing  Screening   125Hz  250Hz  500Hz  1000Hz  2000Hz  3000Hz  4000Hz  6000Hz  8000Hz   Right ear:           Left ear:           Comments: Patient is able to hear conversational tones without difficulty.  No issues reported.  Vision Screening Comments: Wears corrective lenses Visual acuity not assessed, virtual visit.  They have seen their ophthalmologist in the last 12 months.     Dietary issues and exercise activities discussed: Current Exercise Habits: Home exercise routine, Type of exercise: walking, Intensity: Mild  Goals      Patient Stated   . Weight (lb) < 157 lb (71.2 kg) (pt-stated)     I would like to weigh around 140-145lb. Eat healthier, portion control      Depression Screen PHQ 2/9 Scores 12/16/2019 11/07/2019 06/18/2019  PHQ - 2 Score 0 0 0  PHQ- 9 Score - - 0    Fall Risk Fall Risk  12/16/2019 11/07/2019 10/24/2019 06/18/2019  Falls in the past year? 0 0 0 0  Follow up Falls evaluation completed Falls evaluation completed Falls evaluation completed Falls evaluation completed   Timed Get Up and Go Performed no, virtual visit  Cognitive Function:     6CIT Screen 12/16/2019  What Year? 0 points  What month? 0 points  What time? 0 points  Count back from 20 0 points  Months in reverse 0 points  Repeat phrase 0 points  Total Score 0    Screening Tests Health Maintenance  Topic Date Due  . DEXA SCAN  10/16/2005  . TETANUS/TDAP  08/15/2018  . INFLUENZA VACCINE  Completed  . PNA vac Low Risk Adult  Completed     Plan:   Keep all routine maintenance appointments.   Next scheduled lab 05/05/20 @ 8:00  Cpe 05/07/20 @ 8:30   Medicare Attestation I have personally reviewed: The patient's medical and social history Their use of alcohol, tobacco or illicit drugs Their current medications and supplements The patient's functional ability including ADLs,fall risks, home safety risks, cognitive, and hearing and visual impairment Diet and physical activities Evidence for  depression   I have reviewed and discussed with patient certain preventive protocols, quality metrics, and best practice recommendations.   OBrien-Blaney, Merrianne Mccumbers L, LPN   [id number]     I have reviewed the above information and agree with above.   Deborra Medina, MD

## 2019-12-16 NOTE — Patient Instructions (Addendum)
  Michelle Butler , Thank you for taking time to come for your Medicare Wellness Visit. I appreciate your ongoing commitment to your health goals. Please review the following plan we discussed and let me know if I can assist you in the future.   These are the goals we discussed: Goals      Patient Stated   . Weight (lb) < 157 lb (71.2 kg) (pt-stated)     I would like to weigh around 140-145lb. Eat healthier, portion control       This is a list of the screening recommended for you and due dates:  Health Maintenance  Topic Date Due  . DEXA scan (bone density measurement)  10/16/2005  . Tetanus Vaccine  08/15/2018  . Flu Shot  Completed  . Pneumonia vaccines  Completed

## 2020-01-06 ENCOUNTER — Ambulatory Visit
Admission: RE | Admit: 2020-01-06 | Discharge: 2020-01-06 | Disposition: A | Discharge status: Home / Self Care | Payer: Medicare HMO | Source: Ambulatory Visit | Attending: Internal Medicine | Admitting: Internal Medicine

## 2020-01-06 DIAGNOSIS — Z1231 Encounter for screening mammogram for malignant neoplasm of breast: Secondary | ICD-10-CM | POA: Diagnosis not present

## 2020-01-22 ENCOUNTER — Other Ambulatory Visit: Payer: Self-pay | Admitting: Orthopedic Surgery

## 2020-01-22 DIAGNOSIS — M25561 Pain in right knee: Secondary | ICD-10-CM

## 2020-05-05 ENCOUNTER — Other Ambulatory Visit (INDEPENDENT_AMBULATORY_CARE_PROVIDER_SITE_OTHER): Payer: Medicare HMO

## 2020-05-05 ENCOUNTER — Other Ambulatory Visit: Payer: Self-pay

## 2020-05-05 DIAGNOSIS — E039 Hypothyroidism, unspecified: Secondary | ICD-10-CM

## 2020-05-05 DIAGNOSIS — E785 Hyperlipidemia, unspecified: Secondary | ICD-10-CM

## 2020-05-05 LAB — LIPID PANEL
Cholesterol: 227 mg/dL — ABNORMAL HIGH (ref 0–200)
HDL: 64.1 mg/dL (ref >39.00)
LDL Cholesterol: 137 mg/dL — ABNORMAL HIGH (ref 0–99)
NonHDL: 162.62
Total CHOL/HDL Ratio: 4
Triglycerides: 126 mg/dL (ref 0.0–149.0)
VLDL: 25.2 mg/dL (ref 0.0–40.0)

## 2020-05-05 LAB — TSH: TSH: 5.07 u[IU]/mL — ABNORMAL HIGH (ref 0.35–4.50)

## 2020-05-07 ENCOUNTER — Telehealth: Payer: Self-pay | Admitting: Internal Medicine

## 2020-05-07 ENCOUNTER — Ambulatory Visit (INDEPENDENT_AMBULATORY_CARE_PROVIDER_SITE_OTHER): Payer: Medicare HMO | Admitting: Internal Medicine

## 2020-05-07 ENCOUNTER — Encounter: Payer: Self-pay | Admitting: Internal Medicine

## 2020-05-07 ENCOUNTER — Other Ambulatory Visit: Payer: Self-pay

## 2020-05-07 VITALS — BP 110/70 | HR 68 | Temp 98.2°F | Resp 14 | Ht 63.0 in | Wt 157.2 lb

## 2020-05-07 DIAGNOSIS — Z780 Asymptomatic menopausal state: Secondary | ICD-10-CM

## 2020-05-07 DIAGNOSIS — Z0000 Encounter for general adult medical examination without abnormal findings: Secondary | ICD-10-CM | POA: Diagnosis not present

## 2020-05-07 DIAGNOSIS — F409 Phobic anxiety disorder, unspecified: Secondary | ICD-10-CM

## 2020-05-07 DIAGNOSIS — M25561 Pain in right knee: Secondary | ICD-10-CM

## 2020-05-07 DIAGNOSIS — E785 Hyperlipidemia, unspecified: Secondary | ICD-10-CM | POA: Diagnosis not present

## 2020-05-07 DIAGNOSIS — G8929 Other chronic pain: Secondary | ICD-10-CM

## 2020-05-07 DIAGNOSIS — F5105 Insomnia due to other mental disorder: Secondary | ICD-10-CM

## 2020-05-07 DIAGNOSIS — E039 Hypothyroidism, unspecified: Secondary | ICD-10-CM

## 2020-05-07 MED ORDER — LORAZEPAM 1 MG PO TABS: ORAL_TABLET | ORAL | 5 refills | Status: DC

## 2020-05-07 MED ORDER — PAROXETINE HCL 10 MG PO TABS: ORAL_TABLET | ORAL | 1 refills | Status: DC

## 2020-05-07 MED ORDER — LEVOTHYROXINE SODIUM 100 MCG PO TABS: 100.0000 ug | ORAL_TABLET | Freq: Every day | ORAL | 1 refills | Status: DC

## 2020-05-07 NOTE — Telephone Encounter (Signed)
Refill request for ativan, last seen 05-07-20, last filled 10-24-19.  Please advise.

## 2020-05-07 NOTE — Addendum Note (Signed)
Addended by: Crecencio Mc on: 05/07/2020 10:23 PM   Modules accepted: Orders

## 2020-05-07 NOTE — Telephone Encounter (Signed)
Pt said she saw Dr. Derrel Nip today and she was supposed to send levothyorxine, paxil, and lorazepam prescriptions to [facility] and it is not there.

## 2020-05-07 NOTE — Progress Notes (Signed)
Patient ID: Michelle Butler, female    DOB: [birth year]  Age: 79 y.o. MRN: [id number]  The patient is here for annual preventive examination  and management of other chronic and acute problems.   The risk factors are reflected in the social history.  The roster of all physicians providing medical care to patient - is listed in the Snapshot section of the chart.  Activities of daily living:  The patient is 100% independent in all ADLs: dressing, toileting, feeding as well as independent mobility  Home safety : The patient has smoke detectors in the home. They wear seatbelts.  There are no firearms at home. There is no violence in the home.   There is no risks for hepatitis, STDs or HIV. There is no   history of blood transfusion. They have no travel history to infectious disease endemic areas of the world.  The patient has seen their dentist in the last six month. They have seen their eye doctor in the last year. They admit to slight hearing difficulty with regard to whispered voices and some television programs.  They have deferred audiologic testing in the last year.  They do not  have excessive sun exposure. Discussed the need for sun protection: hats, long sleeves and use of sunscreen if there is significant sun exposure.   Diet: the importance of a healthy diet is discussed. They do have a healthy diet.  The benefits of regular aerobic exercise were discussed. She walks 4 times per week ,  20 minutes.   Depression screen: there are no signs or vegative symptoms of depression- irritability, change in appetite, anhedonia, sadness/tearfullness.  Cognitive assessment: the patient manages all their financial and personal affairs and is actively engaged. They could relate day,date,year and events; recalled 2/3 objects at 3 minutes; performed clock-face test normally.  The following portions of the patient's history were reviewed and updated as appropriate: allergies, current medications, past  family history, past medical history,  past surgical history, past social history  and problem list.  Visual acuity was not assessed per patient preference since she has regular follow up with her ophthalmologist. Hearing and body mass index were assessed and reviewed.   During the course of the visit the patient was educated and counseled about appropriate screening and preventive services including : fall prevention , diabetes screening, nutrition counseling, colorectal cancer screening, and recommended immunizations.    CC: The primary encounter diagnosis was Hypothyroidism (acquired). Diagnoses of Chronic pain of right knee, Hyperlipidemia with target LDL less than 130, Postmenopausal estrogen deficiency, Encounter for preventive health examination, and Insomnia due to anxiety and fear were also pertinent to this visit.  1) right knee injury, developed acute pain and instability of knee while walking on the bridge at [facility].  Saw [facility],  Referred to [facility], then to [facility] ,  Steroid shot given,  Diagnosed with  lateral collateral ligament partial tear by MRI ordered by [facility] in May.  Feeling better without surgical intervention. Back to golfing , dancing and walking . Using NSAID prn,   than once a week aleve prn   2) anxiety/insomnia:  Diagnosed in 2009 with a panic attack resulting in an ER visit . First panic attack occurred while vacationing in [place]  Has been using lorazepam prn . Her 81 yr old son died in 62 .  She is using 1/2 lorazepam for early wakeups and needs refill.     History Michelle Butler has no past medical history on file.  She has a past surgical history that includes Tubal ligation (1982); Hernia repair (2013); Appendectomy (1959); right hip replacement (2000); left hip replacement (2011); Tonsillectomy (1959); and Varicose vein surgery (1991).   Her family history includes Breast cancer (age of onset: 60) in an other family member; Hip fracture (age of onset:  43) in her father; Melanoma in her brother; Melanoma (age of onset: 36) in her brother; Peripheral Artery Disease (age of onset: 35) in her mother; Pneumonia (age of onset: 9) in her paternal grandfather; Stroke (age of onset: 73) in her paternal grandmother; Stroke (age of onset: 66) in her maternal grandfather.She reports that she has never smoked. She has never used smokeless tobacco. She reports current alcohol use of about 2.0 standard drinks of alcohol per week. She reports that she does not use drugs.  Outpatient Medications Prior to Visit  Medication Sig Dispense Refill  . carboxymethylcellul-glycerin (REFRESH OPTIVE) 0.5-0.9 % ophthalmic solution 1 drop.    . cholecalciferol (VITAMIN D3) 25 MCG (1000 UNIT) tablet Take 1,000 Units by mouth every other day.    . metroNIDAZOLE (METROCREAM) 0.75 % cream Apply 1 application topically 2 (two) times daily.    . Multiple Vitamin (MULTIVITAMIN) tablet Take 1 tablet by mouth every other day.     . naproxen sodium (ALEVE) 220 MG tablet Take 220 mg by mouth.    . vitamin B-12 (CYANOCOBALAMIN) 1000 MCG tablet Take 1,000 mcg by mouth every other day.    . levothyroxine (SYNTHROID) 100 MCG tablet Take 1 tablet (100 mcg total) by mouth daily. 90 tablet 1  . LORazepam (ATIVAN) 1 MG tablet TAKE 1 TABLET BY MOUTH AT BEDTIME AS NEEDED FOR ANXIETY 30 tablet 5  . PARoxetine (PAXIL) 10 MG tablet TAKE 1 TABLET BY MOUTH EVERY DAY IN THE MORNING 90 tablet 1   No facility-administered medications prior to visit.    Review of Systems  Objective:  BP 110/70 (BP Location: Left Arm, Patient Position: Sitting, Cuff Size: Normal)   Pulse 68   Temp 98.2 F (36.8 C) (Oral)   Resp 14   Ht 5\' 3"  (1.6 m)   Wt 157 lb 3.2 oz (71.3 kg)   SpO2 98%   BMI 27.85 kg/m   Physical Exam    Assessment & Plan:   Problem List Items Addressed This Visit      Unprioritized   Insomnia due to anxiety and fear    She uses lorazepam  no more than twice weekly . The risks  and benefits of  Chronic  benzodiazepine use were discussed with patient today including increased risk of dementia,  Addiction, and seizures if abruptly withdrawn  .       Hypothyroidism (acquired) - Primary    Slightly underactive. However levothyoxine was refilled before labs were reviewed.  Will add one extra dose per week for a total weekly dose of 800 mcg. Repeat tsh in 6 weeks.   Lab Results  Component Value Date   TSH 5.07 (H) 05/05/2020         Relevant Medications   levothyroxine (SYNTHROID) 112 MCG tablet   Other Relevant Orders   TSH   Hyperlipidemia with target LDL less than 130    Reviewed her previous fasting lipid panel done by former PCP in Jan 2020. Using the  [facility] , her 10 yr risk of atherosclerotic events is  7% .   Lab Results  Component Value Date   CHOL 227 (H) 05/05/2020   HDL 64.10 05/05/2020  [place] 137 (H) 05/05/2020   TRIG 126.0 05/05/2020   CHOLHDL 4 05/05/2020         Relevant Orders   Lipid panel   Encounter for preventive health examination    age appropriate education and counseling updated, referrals for preventative services and immunizations addressed, dietary and smoking counseling addressed, most recent labs reviewed.  I have personally reviewed and have noted:  1) the patient's medical and social history 2) The pt's use of alcohol, tobacco, and illicit drugs 3) The patient's current medications and supplements 4) Functional ability including ADL's, fall risk, home safety risk, hearing and visual impairment 5) Diet and physical activities 6) Evidence for depression or mood disorder 7) The patient's height, weight, and BMI have been recorded in the chart  I have made referrals, and provided counseling and education based on review of the above      Chronic pain of right knee    Secondary to partial tear of lateral collateral ligament by MRI in May.  Pain has  Improved with conservative treatment.       Relevant Orders    Comprehensive metabolic panel    Other Visit Diagnoses    Postmenopausal estrogen deficiency       Relevant Orders   DG Bone Density      I have changed Michelle Coss "Doley"'s levothyroxine. I am also having her maintain her metroNIDAZOLE, carboxymethylcellul-glycerin, naproxen sodium, multivitamin, vitamin B-12, and cholecalciferol.  Meds ordered this encounter  Medications  . levothyroxine (SYNTHROID) 112 MCG tablet    Sig: Take 1 tablet (112 mcg total) by mouth daily.    Dispense:  90 tablet    Refill:  0    For next refill    Medications Discontinued During This Encounter  Medication Reason  . levothyroxine (SYNTHROID) 100 MCG tablet     Follow-up: No follow-ups on file.   Crecencio Mc, MD

## 2020-05-07 NOTE — Patient Instructions (Addendum)
For joint pain:  You can add up to 2000 mg of acetominophen (tylenol) every day safely  In divided doses (500 mg every 6 hours  Or 1000 mg every 12 hours.)  Or 4000 mg  Daily For a short period of time ( for a period up to  one week)   Your DEXA has been ordered.  You are encouraged (required) to call to make your appointment at [facility]  We'll repeat fasting labs in mid September    Health Maintenance After Age 79 After age 72, you are at a higher risk for certain long-term diseases and infections as well as injuries from falls. Falls are a major cause of broken bones and head injuries in people who are older than age 16. Getting regular preventive care can help to keep you healthy and well. Preventive care includes getting regular testing and making lifestyle changes as recommended by your health care provider. Talk with your health care provider about:  Which screenings and tests you should have. A screening is a test that checks for a disease when you have no symptoms.  A diet and exercise plan that is right for you. What should I know about screenings and tests to prevent falls? Screening and testing are the best ways to find a health problem early. Early diagnosis and treatment give you the best chance of managing medical conditions that are common after age 67. Certain conditions and lifestyle choices may make you more likely to have a fall. Your health care provider may recommend:  Regular vision checks. Poor vision and conditions such as cataracts can make you more likely to have a fall. If you wear glasses, make sure to get your prescription updated if your vision changes.  Medicine review. Work with your health care provider to regularly review all of the medicines you are taking, including over-the-counter medicines. Ask your health care provider about any side effects that may make you more likely to have a fall. Tell your health care provider if any medicines that you  take make you feel dizzy or sleepy.  Osteoporosis screening. Osteoporosis is a condition that causes the bones to get weaker. This can make the bones weak and cause them to break more easily.  Blood pressure screening. Blood pressure changes and medicines to control blood pressure can make you feel dizzy.  Strength and balance checks. Your health care provider may recommend certain tests to check your strength and balance while standing, walking, or changing positions.  Foot health exam. Foot pain and numbness, as well as not wearing proper footwear, can make you more likely to have a fall.  Depression screening. You may be more likely to have a fall if you have a fear of falling, feel emotionally low, or feel unable to do activities that you used to do.  Alcohol use screening. Using too much alcohol can affect your balance and may make you more likely to have a fall. What actions can I take to lower my risk of falls? General instructions  Talk with your health care provider about your risks for falling. Tell your health care provider if: ? You fall. Be sure to tell your health care provider about all falls, even ones that seem minor. ? You feel dizzy, sleepy, or off-balance.  Take over-the-counter and prescription medicines only as told by your health care provider. These include any supplements.  Eat a healthy diet and maintain a healthy weight. A healthy diet includes low-fat dairy  products, low-fat (lean) meats, and fiber from whole grains, beans, and lots of fruits and vegetables. Home safety  Remove any tripping hazards, such as rugs, cords, and clutter.  Install safety equipment such as grab bars in bathrooms and safety rails on stairs.  Keep rooms and walkways well-lit. Activity   Follow a regular exercise program to stay fit. This will help you maintain your balance. Ask your health care provider what types of exercise are appropriate for you.  If you need a cane or  walker, use it as recommended by your health care provider.  Wear supportive shoes that have nonskid soles. Lifestyle  Do not drink alcohol if your health care provider tells you not to drink.  If you drink alcohol, limit how much you have: ? 0-1 drink a day for women. ? 0-2 drinks a day for men.  Be aware of how much alcohol is in your drink. In the [place] one drink equals one typical bottle of beer (12 oz), one-half glass of wine (5 oz), or one shot of hard liquor (1 oz).  Do not use any products that contain nicotine or tobacco, such as cigarettes and e-cigarettes. If you need help quitting, ask your health care provider. Summary  Having a healthy lifestyle and getting preventive care can help to protect your health and wellness after age 24.  Screening and testing are the best way to find a health problem early and help you avoid having a fall. Early diagnosis and treatment give you the best chance for managing medical conditions that are more common for people who are older than age 66.  Falls are a major cause of broken bones and head injuries in people who are older than age 7. Take precautions to prevent a fall at home.  Work with your health care provider to learn what changes you can make to improve your health and wellness and to prevent falls. This information is not intended to replace advice given to you by your health care provider. Make sure you discuss any questions you have with your health care provider. Document Revised: 01/17/2019 Document Reviewed: 08/09/2017 Elsevier Patient Education  2020 [employer].

## 2020-05-08 NOTE — Telephone Encounter (Signed)
Pt is aware that all medications have been refilled.

## 2020-05-10 ENCOUNTER — Telehealth: Payer: Self-pay | Admitting: Internal Medicine

## 2020-05-10 MED ORDER — LEVOTHYROXINE SODIUM 112 MCG PO TABS: 112.0000 ug | ORAL_TABLET | Freq: Every day | ORAL | 0 refills | Status: DC

## 2020-05-10 NOTE — Assessment & Plan Note (Signed)
Slightly underactive. However levothyoxine was refilled before labs were reviewed.  Will add one extra dose per week for a total weekly dose of 800 mcg. Repeat tsh in 6 weeks.   Lab Results  Component Value Date   TSH 5.07 (H) 05/05/2020

## 2020-05-10 NOTE — Assessment & Plan Note (Signed)
Reviewed her previous fasting lipid panel done by former PCP in Jan 2020. Using the  [facility] , her 10 yr risk of atherosclerotic events is  7% .   Lab Results  Component Value Date   CHOL 227 (H) 05/05/2020   HDL 64.10 05/05/2020   LDLCALC 137 (H) 05/05/2020   TRIG 126.0 05/05/2020   CHOLHDL 4 05/05/2020

## 2020-05-10 NOTE — Assessment & Plan Note (Signed)
She uses lorazepam  no more than twice weekly . The risks and benefits of  Chronic  benzodiazepine use were discussed with patient today including increased risk of dementia,  Addiction, and seizures if abruptly withdrawn  .

## 2020-05-10 NOTE — Assessment & Plan Note (Signed)
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  Pain has  Improved with conservative treatment.

## 2020-05-10 NOTE — Telephone Encounter (Signed)
MyChart message sent about her thyroid level. She may not understand the instructions. And may call.  Her dose was refilled prior to labs showing that it needed to be increased , so I have advised her to use the 100 mcg tablets and take 8 per week instead of 7,  Until they are used up.  Then the newer higher dose will be available 112 mcg daily.   Needs TSH /lab appt in 6 weeks

## 2020-05-10 NOTE — Assessment & Plan Note (Signed)

## 2020-05-11 NOTE — Telephone Encounter (Signed)
Pt received mychart message.

## 2020-05-19 ENCOUNTER — Other Ambulatory Visit: Payer: Self-pay | Admitting: Internal Medicine

## 2020-05-19 ENCOUNTER — Ambulatory Visit
Admission: RE | Admit: 2020-05-19 | Discharge: 2020-05-19 | Disposition: A | Discharge status: Home / Self Care | Payer: Medicare HMO | Source: Ambulatory Visit | Attending: Internal Medicine | Admitting: Internal Medicine

## 2020-05-19 ENCOUNTER — Other Ambulatory Visit: Payer: Self-pay

## 2020-05-19 DIAGNOSIS — Z780 Asymptomatic menopausal state: Secondary | ICD-10-CM | POA: Diagnosis not present

## 2020-05-20 ENCOUNTER — Encounter: Payer: Self-pay | Admitting: Internal Medicine

## 2020-06-24 ENCOUNTER — Other Ambulatory Visit (INDEPENDENT_AMBULATORY_CARE_PROVIDER_SITE_OTHER): Payer: Medicare HMO

## 2020-06-24 ENCOUNTER — Other Ambulatory Visit: Payer: Self-pay

## 2020-06-24 DIAGNOSIS — E039 Hypothyroidism, unspecified: Secondary | ICD-10-CM | POA: Diagnosis not present

## 2020-06-24 DIAGNOSIS — G8929 Other chronic pain: Secondary | ICD-10-CM

## 2020-06-24 DIAGNOSIS — E785 Hyperlipidemia, unspecified: Secondary | ICD-10-CM

## 2020-06-24 DIAGNOSIS — M25561 Pain in right knee: Secondary | ICD-10-CM

## 2020-06-24 LAB — LIPID PANEL
Cholesterol: 201 mg/dL — ABNORMAL HIGH (ref 0–200)
HDL: 59.4 mg/dL (ref >39.00)
LDL Cholesterol: 115 mg/dL — ABNORMAL HIGH (ref 0–99)
NonHDL: 141.2
Total CHOL/HDL Ratio: 3
Triglycerides: 132 mg/dL (ref 0.0–149.0)
VLDL: 26.4 mg/dL (ref 0.0–40.0)

## 2020-06-24 LAB — COMPREHENSIVE METABOLIC PANEL
ALT: 16 U/L (ref 0–35)
AST: 21 U/L (ref 0–37)
Albumin: 4.2 g/dL (ref 3.5–5.2)
Alkaline Phosphatase: 39 U/L (ref 39–117)
BUN: 15 mg/dL (ref 6–23)
CO2: 26 mEq/L (ref 19–32)
Calcium: 9.3 mg/dL (ref 8.4–10.5)
Chloride: 102 mEq/L (ref 96–112)
Creatinine, Ser: 0.9 mg/dL (ref 0.40–1.20)
GFR: 60.29 mL/min (ref >60.00)
Glucose, Bld: 87 mg/dL (ref 70–99)
Potassium: 4.2 mEq/L (ref 3.5–5.1)
Sodium: 137 mEq/L (ref 135–145)
Total Bilirubin: 0.7 mg/dL (ref 0.2–1.2)
Total Protein: 6.8 g/dL (ref 6.0–8.3)

## 2020-06-24 LAB — TSH: TSH: 0.37 u[IU]/mL (ref 0.35–4.50)

## 2020-06-26 ENCOUNTER — Telehealth: Payer: Self-pay | Admitting: Internal Medicine

## 2020-06-26 DIAGNOSIS — E039 Hypothyroidism, unspecified: Secondary | ICD-10-CM

## 2020-06-26 MED ORDER — LEVOTHYROXINE SODIUM 100 MCG PO TABS
100.0000 ug | ORAL_TABLET | Freq: Every day | ORAL | 0 refills | Status: DC
Start: 2020-06-26 — End: 2020-12-17

## 2020-06-26 NOTE — Assessment & Plan Note (Signed)
Lowering dose to 100 mcg for TSH 0.35

## 2020-06-26 NOTE — Addendum Note (Signed)
Addended by: Crecencio Mc on: 06/26/2020 01:05 PM   Modules accepted: Orders

## 2020-08-03 ENCOUNTER — Telehealth: Payer: Self-pay | Admitting: *Deleted

## 2020-08-03 NOTE — Telephone Encounter (Signed)
Please place future orders for lab appt.  

## 2020-08-03 NOTE — Telephone Encounter (Signed)
Can you not see the TSH ordered (twice ? that's all she needs . (one, that is)

## 2020-08-04 ENCOUNTER — Other Ambulatory Visit: Payer: Medicare HMO

## 2020-08-06 ENCOUNTER — Other Ambulatory Visit: Payer: Self-pay

## 2020-08-06 ENCOUNTER — Other Ambulatory Visit (INDEPENDENT_AMBULATORY_CARE_PROVIDER_SITE_OTHER): Payer: Medicare HMO

## 2020-08-06 DIAGNOSIS — E039 Hypothyroidism, unspecified: Secondary | ICD-10-CM

## 2020-08-06 LAB — TSH: TSH: 1 u[IU]/mL (ref 0.35–4.50)

## 2020-08-06 NOTE — Progress Notes (Signed)
Thyroid function is normal on current dose.  No current changes needed.    Regards,   Deborra Medina, MD

## 2020-09-30 DIAGNOSIS — E538 Deficiency of other specified B group vitamins: Secondary | ICD-10-CM

## 2020-09-30 DIAGNOSIS — R7301 Impaired fasting glucose: Secondary | ICD-10-CM

## 2020-09-30 DIAGNOSIS — E785 Hyperlipidemia, unspecified: Secondary | ICD-10-CM

## 2020-09-30 DIAGNOSIS — E559 Vitamin D deficiency, unspecified: Secondary | ICD-10-CM

## 2020-09-30 NOTE — Telephone Encounter (Signed)
I have ordered Lipid panel, CBC, A1c, CMP, B12 and vitamin d for lab appt before physical on 11/10/2019. Is there anything else that needs to be ordered.   TSH was just done in 07/2020.

## 2020-11-05 ENCOUNTER — Other Ambulatory Visit: Payer: Medicare HMO

## 2020-11-09 ENCOUNTER — Ambulatory Visit: Payer: Medicare HMO | Admitting: Internal Medicine

## 2020-12-15 ENCOUNTER — Other Ambulatory Visit (INDEPENDENT_AMBULATORY_CARE_PROVIDER_SITE_OTHER): Payer: Medicare HMO

## 2020-12-15 ENCOUNTER — Other Ambulatory Visit: Payer: Self-pay

## 2020-12-15 DIAGNOSIS — R7301 Impaired fasting glucose: Secondary | ICD-10-CM

## 2020-12-15 DIAGNOSIS — E785 Hyperlipidemia, unspecified: Secondary | ICD-10-CM | POA: Diagnosis not present

## 2020-12-15 DIAGNOSIS — E538 Deficiency of other specified B group vitamins: Secondary | ICD-10-CM | POA: Diagnosis not present

## 2020-12-15 DIAGNOSIS — E559 Vitamin D deficiency, unspecified: Secondary | ICD-10-CM

## 2020-12-15 LAB — CBC WITH DIFFERENTIAL/PLATELET
Basophils Absolute: 0 10*3/uL (ref 0.0–0.1)
Basophils Relative: 0.7 % (ref 0.0–3.0)
Eosinophils Absolute: 0 10*3/uL (ref 0.0–0.7)
Eosinophils Relative: 1.2 % (ref 0.0–5.0)
HCT: 37.6 % (ref 36.0–46.0)
Hemoglobin: 13.2 g/dL (ref 12.0–15.0)
Lymphocytes Relative: 36.4 % (ref 12.0–46.0)
Lymphs Abs: 1.5 10*3/uL (ref 0.7–4.0)
MCHC: 35.1 g/dL (ref 30.0–36.0)
MCV: 93.4 fl (ref 78.0–100.0)
Monocytes Absolute: 0.6 10*3/uL (ref 0.1–1.0)
Monocytes Relative: 13.8 % — ABNORMAL HIGH (ref 3.0–12.0)
Neutro Abs: 2 10*3/uL (ref 1.4–7.7)
Neutrophils Relative %: 47.9 % (ref 43.0–77.0)
Platelets: 139 10*3/uL — ABNORMAL LOW (ref 150.0–400.0)
RBC: 4.03 Mil/uL (ref 3.87–5.11)
RDW: 13.3 % (ref 11.5–15.5)
WBC: 4.2 10*3/uL (ref 4.0–10.5)

## 2020-12-15 LAB — COMPREHENSIVE METABOLIC PANEL
ALT: 14 U/L (ref 0–35)
AST: 24 U/L (ref 0–37)
Albumin: 4 g/dL (ref 3.5–5.2)
Alkaline Phosphatase: 38 U/L — ABNORMAL LOW (ref 39–117)
BUN: 11 mg/dL (ref 6–23)
CO2: 27 mEq/L (ref 19–32)
Calcium: 9.2 mg/dL (ref 8.4–10.5)
Chloride: 104 mEq/L (ref 96–112)
Creatinine, Ser: 0.87 mg/dL (ref 0.40–1.20)
GFR: 63.04 mL/min (ref >60.00)
Glucose, Bld: 85 mg/dL (ref 70–99)
Potassium: 4.1 mEq/L (ref 3.5–5.1)
Sodium: 138 mEq/L (ref 135–145)
Total Bilirubin: 0.7 mg/dL (ref 0.2–1.2)
Total Protein: 6.7 g/dL (ref 6.0–8.3)

## 2020-12-15 LAB — LIPID PANEL
Cholesterol: 186 mg/dL (ref 0–200)
HDL: 61.3 mg/dL (ref >39.00)
LDL Cholesterol: 109 mg/dL — ABNORMAL HIGH (ref 0–99)
NonHDL: 124.23
Total CHOL/HDL Ratio: 3
Triglycerides: 76 mg/dL (ref 0.0–149.0)
VLDL: 15.2 mg/dL (ref 0.0–40.0)

## 2020-12-15 LAB — VITAMIN D 25 HYDROXY (VIT D DEFICIENCY, FRACTURES): VITD: 32.35 ng/mL (ref 30.00–100.00)

## 2020-12-15 LAB — HEMOGLOBIN A1C: Hgb A1c MFr Bld: 5.4 % (ref 4.6–6.5)

## 2020-12-15 LAB — VITAMIN B12: Vitamin B-12: 725 pg/mL (ref 211–911)

## 2020-12-16 ENCOUNTER — Ambulatory Visit (INDEPENDENT_AMBULATORY_CARE_PROVIDER_SITE_OTHER): Payer: Medicare HMO

## 2020-12-16 VITALS — Ht 63.0 in | Wt 157.0 lb

## 2020-12-16 DIAGNOSIS — Z0000 Encounter for general adult medical examination without abnormal findings: Secondary | ICD-10-CM

## 2020-12-16 DIAGNOSIS — Z1231 Encounter for screening mammogram for malignant neoplasm of breast: Secondary | ICD-10-CM

## 2020-12-16 NOTE — Progress Notes (Addendum)
Subjective:   Michelle Butler is a 80 y.o. female who presents for Medicare Annual (Subsequent) preventive examination.  Review of Systems    No ROS.  Medicare Wellness Virtual Visit.    Cardiac Risk Factors include: advanced age (>60men, >40 women)     Objective:    Today's Vitals   12/16/20 1240  Weight: 157 lb (71.2 kg)  Height: 5\' 3"  (1.6 m)   Body mass index is 27.81 kg/m.  Advanced Directives 12/16/2020 12/16/2019  Does Patient Have a Medical Advance Directive? Yes Yes  Type of Paramedic of [place];Living will [place];Living will  Does patient want to make changes to medical advance directive? No - Patient declined No - Patient declined  Copy of [place] in Chart? No - copy requested No - copy requested    Current Medications (verified) Outpatient Encounter Medications as of 12/16/2020  Medication Sig   Magnesium 250 MG TABS Take 1 tablet by mouth.   carboxymethylcellul-glycerin (REFRESH OPTIVE) 0.5-0.9 % ophthalmic solution 1 drop.   cholecalciferol (VITAMIN D3) 25 MCG (1000 UNIT) tablet Take 1,000 Units by mouth every other day.   levothyroxine (SYNTHROID) 100 MCG tablet Take 1 tablet (100 mcg total) by mouth daily.   LORazepam (ATIVAN) 1 MG tablet TAKE 1 TABLET BY MOUTH AT BEDTIME AS NEEDED FOR ANXIETY   metroNIDAZOLE (METROCREAM) 0.75 % cream Apply 1 application topically 2 (two) times daily.   Multiple Vitamin (MULTIVITAMIN) tablet Take 1 tablet by mouth every other day.    naproxen sodium (ALEVE) 220 MG tablet Take 220 mg by mouth.   PARoxetine (PAXIL) 10 MG tablet TAKE 1 TABLET BY MOUTH EVERY DAY IN THE MORNING   vitamin B-12 (CYANOCOBALAMIN) 1000 MCG tablet Take 1,000 mcg by mouth every other day.   No facility-administered encounter medications on file as of 12/16/2020.    Allergies (verified) Citalopram and Duloxetine   History: History reviewed. No pertinent past medical history. 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  [place]   Family History  Problem Relation Age of Onset   Peripheral Artery Disease Mother 87   Hip fracture Father 2   Melanoma Brother 41   Stroke Maternal Grandfather 7   Stroke Paternal Grandmother 74   Pneumonia Paternal Grandfather 31   Melanoma Brother    Breast cancer Other 9       [place]   Social History   Socioeconomic History   Marital status: Married    Spouse name: Not on file   Number of children: Not on file   Years of education: Not on file   Highest education level: Not on file  Occupational History   Not on file  Tobacco Use   Smoking status: Never Smoker   Smokeless tobacco: Never Used  Substance and Sexual Activity   Alcohol use: Yes    Alcohol/week: 2.0 standard drinks    Types: 2 Glasses of wine per week   Drug use: Never   Sexual activity: Not on file  Other Topics Concern   Not on file  Social History Narrative   Married    [place]   Social Determinants of Health   Financial Resource Strain:  Low Risk    Difficulty of Paying Living Expenses: Not hard at all  Food Insecurity: No Food Insecurity   Worried About Running Out of Food in the Last Year: Never true   Ran Out of Food in the Last Year: Never true  Transportation Needs: No Transportation Needs   Lack of Transportation (Medical): No   Lack of Transportation (Non-Medical): No  Physical Activity: Not on file  Stress: No Stress Concern Present   Feeling of Stress : Not at all  Social Connections: Unknown   Frequency of Communication with Friends and Family: Not on file   Frequency of Social Gatherings with Friends and Family: Not on file   Attends Religious Services: Not on file   Active Member of Clubs or Organizations: Not on file    Attends Archivist Meetings: Not on file   Marital Status: Married    Tobacco Counseling Counseling given: Not Answered   Clinical Intake:  Pre-visit preparation completed: Yes        Diabetes: No  How often do you need to have someone help you when you read instructions, pamphlets, or other written materials from your doctor or pharmacy?: 1 - Never    Interpreter Needed?: No      Activities of Daily Living In your present state of health, do you have any difficulty performing the following activities: 12/16/2020  Hearing? N  Vision? N  Difficulty concentrating or making decisions? N  Walking or climbing stairs? N  Dressing or bathing? N  Doing errands, shopping? N  Preparing Food and eating ? N  Using the Toilet? N  In the past six months, have you accidently leaked urine? N  Do you have problems with loss of bowel control? N  Managing your Medications? N  Managing your Finances? N  Housekeeping or managing your Housekeeping? N  Some recent data might be hidden    Patient Care Team: Crecencio Mc, MD as PCP - General (Internal Medicine)  Indicate any recent Medical Services you may have received from other than [facility] providers in the past year (date may be approximate).     Assessment:   This is a routine wellness examination for [facility].  I connected with Michelle Butler today by telephone and verified that I am speaking with the correct person using two identifiers. Location patient: home Location provider: work Persons participating in the virtual visit: patient, Marine scientist.    I discussed the limitations, risks, security and privacy concerns of performing an evaluation and management service by telephone and the availability of in person appointments. The patient expressed understanding and verbally consented to this telephonic visit.    Interactive audio and video telecommunications were attempted between this provider and patient, however failed, due  to patient having technical difficulties OR patient did not have access to video capability.  We continued and completed visit with audio only.  Some vital signs may be absent or patient reported.   Hearing/Vision screen  Hearing Screening   125Hz  250Hz  500Hz  1000Hz  2000Hz  3000Hz  4000Hz  6000Hz  8000Hz   Right ear:           Left ear:           Comments: Patient is able to hear conversational tones without difficulty.  No issues reported.  Vision Screening Comments: Wears corrective lenses Visual acuity not assessed, virtual visit.       Dietary issues and exercise activities discussed: Current Exercise Habits: Home exercise routine, Type of exercise: walking (pilates, aerobics), Time (  Minutes): 60, Frequency (Times/Week): 7, Weekly Exercise (Minutes/Week): 420, Intensity: Moderate  Healthy diet Good water intake  Goals       Patient Stated     Weight (lb) < 157 lb (71.2 kg) (pt-stated)      I would like to weigh around 140-145lb. Eat healthier, portion control       Depression Screen PHQ 2/9 Scores 12/16/2020 12/16/2019 11/07/2019 06/18/2019  PHQ - 2 Score 0 0 0 0  PHQ- 9 Score - - - 0    Fall Risk Fall Risk  12/16/2020 05/07/2020 12/16/2019 11/07/2019 10/24/2019  Falls in the past year? 0 0 0 0 0  Number falls in past yr: 0 - - - -  Injury with Fall? 0 - - - -  Follow up Falls evaluation completed Falls evaluation completed Falls evaluation completed Falls evaluation completed Falls evaluation completed    [place]: Handrails in use when climbing stairs? Yes Home free of loose throw rugs in walkways, pet beds, electrical cords, etc? Yes  Adequate lighting in your home to reduce risk of falls? Yes   ASSISTIVE DEVICES UTILIZED TO PREVENT FALLS: Life alert? No  Use of a cane, walker or w/c? No    TIMED UP AND GO: Was the test performed? No . Virtual visit.   Cognitive Function:  Patient is alert and oriented x3.  Denies difficulty focusing,  making decisions, memory loss.  Enjoys brain health activities like crossword/jugsaw puzzles and games. MMSE/6CIT deferred. Normal by direct communication/observation.    6CIT Screen 12/16/2019  What Year? 0 points  What month? 0 points  What time? 0 points  Count back from 20 0 points  Months in reverse 0 points  Repeat phrase 0 points  Total Score 0    Immunizations Immunization History  Administered Date(s) Administered   Fluad Quad(high Dose 65+) 06/18/2019   Hepatitis A 06/10/2005   Influenza-Unspecified 07/16/2020   Moderna Sars-Covid-2 Vaccination 10/22/2019, 11/19/2019, 08/25/2020   Pneumococcal Conjugate-13 07/10/2014   Pneumococcal Polysaccharide-23 07/10/2002, 08/25/2008, 06/11/2019   Tdap 08/15/2008, 05/29/2020   Zoster 01/14/2010   Zoster Recombinat (Shingrix) 07/10/2019, 09/23/2019   Health Maintenance Health Maintenance  Topic Date Due   COVID-19 Vaccine (4 - Booster for Moderna series) 02/22/2021   TETANUS/TDAP  05/29/2030   INFLUENZA VACCINE  Completed   DEXA SCAN  Completed   PNA vac Low Risk Adult  Completed   HPV VACCINES  Aged Out   Colorectal cancer screening: Type of screening: Cologuard. Completed 11/18/19. Repeat every 3 years   Mammogram status: Completed 01/06/20. Repeat every year. Ordered.   Bone Density status: Completed 05/19/20. Results reflect: Bone density results: OSTEOPOROSIS. Repeat every 2 years. cholecalciferol (VITAMIN D3) 25 MCG (1000 UNIT) tablet.  Lung Cancer Screening: (Low Dose CT Chest recommended if Age 96-80 years, 30 pack-year currently smoking OR have quit w/in 15years.) does not qualify.   Hepatitis C Screening: does not qualify.  Vision Screening: Recommended annual ophthalmology exams for early detection of glaucoma and other disorders of the eye. Is the patient up to date with their annual eye exam?  Yes  Who is the provider or what is the name of the office in which the patient attends annual eye exams? [facility].    Dental Screening: Recommended annual dental exams for proper oral hygiene.  Community Resource Referral / Chronic Care Management: CRR required this visit?  No   CCM required this visit?  No  Plan:   Keep all routine maintenance appointments.   Follow up 12/17/20 @ 9:30.  I have personally reviewed and noted the following in the patient's chart:   Medical and social history Use of alcohol, tobacco or illicit drugs  Current medications and supplements Functional ability and status Nutritional status Physical activity Advanced directives List of other physicians Hospitalizations, surgeries, and ER visits in previous 12 months Vitals Screenings to include cognitive, depression, and falls Referrals and appointments  In addition, I have reviewed and discussed with patient certain preventive protocols, quality metrics, and best practice recommendations. A written personalized care plan for preventive services as well as general preventive health recommendations were provided to patient via mychart.     OBrien-Blaney, [place] Fant L, LPN   05/10/8562     I have reviewed the above information and agree with above.   Deborra Medina, MD

## 2020-12-16 NOTE — Patient Instructions (Addendum)
Michelle Butler , Thank you for taking time to come for your Medicare Wellness Visit. I appreciate your ongoing commitment to your health goals. Please review the following plan we discussed and let me know if I can assist you in the future.   These are the goals we discussed: Goals      Patient Stated   .  Weight (lb) < 157 lb (71.2 kg) (pt-stated)      I would like to weigh around 140-145lb. Eat healthier, portion control       This is a list of the screening recommended for you and due dates:  Health Maintenance  Topic Date Due  . COVID-19 Vaccine (4 - Booster for Moderna series) 02/22/2021  . Tetanus Vaccine  05/29/2030  . Flu Shot  Completed  . DEXA scan (bone density measurement)  Completed  . Pneumonia vaccines  Completed  . HPV Vaccine  Aged Out    Immunizations Immunization History  Administered Date(s) Administered  . Fluad Quad(high Dose 65+) 06/18/2019  . Hepatitis A 06/10/2005  . Influenza-Unspecified 07/16/2020  . Moderna Sars-Covid-2 Vaccination 10/22/2019, 11/19/2019, 08/25/2020  . Pneumococcal Conjugate-13 07/10/2014  . Pneumococcal Polysaccharide-23 07/10/2002, 08/25/2008, 06/11/2019  . Tdap 08/15/2008, 05/29/2020  . Zoster 01/14/2010  . Zoster Recombinat (Shingrix) 07/10/2019, 09/23/2019   Keep all routine maintenance appointments.   Follow up 12/17/20 @ 9:30.  Advanced directives: End of life planning; Advance aging; Advanced directives discussed.  Copy of current HCPOA/Living Will requested.    Conditions/risks identified: none new.   Follow up in one year for your annual wellness visit.   Preventive Care 33 Years and Older, Female Preventive care refers to lifestyle choices and visits with your health care provider that can promote health and wellness. What does preventive care include?  A yearly physical exam. This is also called an annual well check.  Dental exams once or twice a year.  Routine eye exams. Ask your health care provider how  often you should have your eyes checked.  Personal lifestyle choices, including:  Daily care of your teeth and gums.  Regular physical activity.  Eating a healthy diet.  Avoiding tobacco and drug use.  Limiting alcohol use.  Practicing safe sex.  Taking low-dose aspirin every day.  Taking vitamin and mineral supplements as recommended by your health care provider. What happens during an annual well check? The services and screenings done by your health care provider during your annual well check will depend on your age, overall health, lifestyle risk factors, and family history of disease. Counseling  Your health care provider may ask you questions about your:  Alcohol use.  Tobacco use.  Drug use.  Emotional well-being.  Home and relationship well-being.  Sexual activity.  Eating habits.  History of falls.  Memory and ability to understand (cognition).  Work and work Statistician.  Reproductive health. Screening  You may have the following tests or measurements:  Height, weight, and BMI.  Blood pressure.  Lipid and cholesterol levels. These may be checked every 5 years, or more frequently if you are over 85 years old.  Skin check.  Lung cancer screening. You may have this screening every year starting at age 5 if you have a 30-pack-year history of smoking and currently smoke or have quit within the past 15 years.  Fecal occult blood test (FOBT) of the stool. You may have this test every year starting at age 15.  Flexible sigmoidoscopy or colonoscopy. You may have a sigmoidoscopy every 5 years  or a colonoscopy every 10 years starting at age 68.  Hepatitis C blood test.  Hepatitis B blood test.  Sexually transmitted disease (STD) testing.  Diabetes screening. This is done by checking your blood sugar (glucose) after you have not eaten for a while (fasting). You may have this done every 1-3 years.  Bone density scan. This is done to screen for  osteoporosis. You may have this done starting at age 16.  Mammogram. This may be done every 1-2 years. Talk to your health care provider about how often you should have regular mammograms. Talk with your health care provider about your test results, treatment options, and if necessary, the need for more tests. Vaccines  Your health care provider may recommend certain vaccines, such as:  Influenza vaccine. This is recommended every year.  Tetanus, diphtheria, and acellular pertussis (Tdap, Td) vaccine. You may need a Td booster every 10 years.  Zoster vaccine. You may need this after age 55.  Pneumococcal 13-valent conjugate (PCV13) vaccine. One dose is recommended after age 104.  Pneumococcal polysaccharide (PPSV23) vaccine. One dose is recommended after age 79. Talk to your health care provider about which screenings and vaccines you need and how often you need them. This information is not intended to replace advice given to you by your health care provider. Make sure you discuss any questions you have with your health care provider. Document Released: 10/23/2015 Document Revised: 06/15/2016 Document Reviewed: 07/28/2015 Elsevier Interactive Patient Education  2017 [employer] Prevention in the Home Falls can cause injuries. They can happen to people of all ages. There are many things you can do to make your home safe and to help prevent falls. What can I do on the outside of my home?  Regularly fix the edges of walkways and driveways and fix any cracks.  Remove anything that might make you trip as you walk through a door, such as a raised step or threshold.  Trim any bushes or trees on the path to your home.  Use bright outdoor lighting.  Clear any walking paths of anything that might make someone trip, such as rocks or tools.  Regularly check to see if handrails are loose or broken. Make sure that both sides of any steps have handrails.  Any raised decks and porches  should have guardrails on the edges.  Have any leaves, snow, or ice cleared regularly.  Use sand or salt on walking paths during winter.  Clean up any spills in your garage right away. This includes oil or grease spills. What can I do in the bathroom?  Use night lights.  Install grab bars by the toilet and in the tub and shower. Do not use towel bars as grab bars.  Use non-skid mats or decals in the tub or shower.  If you need to sit down in the shower, use a plastic, non-slip stool.  Keep the floor dry. Clean up any water that spills on the floor as soon as it happens.  Remove soap buildup in the tub or shower regularly.  Attach bath mats securely with double-sided non-slip rug tape.  Do not have throw rugs and other things on the floor that can make you trip. What can I do in the bedroom?  Use night lights.  Make sure that you have a light by your bed that is easy to reach.  Do not use any sheets or blankets that are too big for your bed. They should not hang down  onto the floor.  Have a firm chair that has side arms. You can use this for support while you get dressed.  Do not have throw rugs and other things on the floor that can make you trip. What can I do in the kitchen?  Clean up any spills right away.  Avoid walking on wet floors.  Keep items that you use a lot in easy-to-reach places.  If you need to reach something above you, use a strong step stool that has a grab bar.  Keep electrical cords out of the way.  Do not use floor polish or wax that makes floors slippery. If you must use wax, use non-skid floor wax.  Do not have throw rugs and other things on the floor that can make you trip. What can I do with my stairs?  Do not leave any items on the stairs.  Make sure that there are handrails on both sides of the stairs and use them. Fix handrails that are broken or loose. Make sure that handrails are as long as the stairways.  Check any carpeting to  make sure that it is firmly attached to the stairs. Fix any carpet that is loose or worn.  Avoid having throw rugs at the top or bottom of the stairs. If you do have throw rugs, attach them to the floor with carpet tape.  Make sure that you have a light switch at the top of the stairs and the bottom of the stairs. If you do not have them, ask someone to add them for you. What else can I do to help prevent falls?  Wear shoes that:  Do not have high heels.  Have rubber bottoms.  Are comfortable and fit you well.  Are closed at the toe. Do not wear sandals.  If you use a stepladder:  Make sure that it is fully opened. Do not climb a closed stepladder.  Make sure that both sides of the stepladder are locked into place.  Ask someone to hold it for you, if possible.  Clearly mark and make sure that you can see:  Any grab bars or handrails.  First and last steps.  Where the edge of each step is.  Use tools that help you move around (mobility aids) if they are needed. These include:  Canes.  Walkers.  Scooters.  Crutches.  Turn on the lights when you go into a dark area. Replace any light bulbs as soon as they burn out.  Set up your furniture so you have a clear path. Avoid moving your furniture around.  If any of your floors are uneven, fix them.  If there are any pets around you, be aware of where they are.  Review your medicines with your doctor. Some medicines can make you feel dizzy. This can increase your chance of falling. Ask your doctor what other things that you can do to help prevent falls. This information is not intended to replace advice given to you by your health care provider. Make sure you discuss any questions you have with your health care provider. Document Released: 07/23/2009 Document Revised: 03/03/2016 Document Reviewed: 10/31/2014 Elsevier Interactive Patient Education  2017 [employer].

## 2020-12-17 ENCOUNTER — Other Ambulatory Visit: Payer: Self-pay

## 2020-12-17 ENCOUNTER — Encounter: Payer: Self-pay | Admitting: Internal Medicine

## 2020-12-17 ENCOUNTER — Ambulatory Visit (INDEPENDENT_AMBULATORY_CARE_PROVIDER_SITE_OTHER): Payer: Medicare HMO | Admitting: Internal Medicine

## 2020-12-17 VITALS — BP 112/78 | HR 65 | Temp 98.2°F | Resp 16 | Ht 63.25 in | Wt 157.4 lb

## 2020-12-17 DIAGNOSIS — E538 Deficiency of other specified B group vitamins: Secondary | ICD-10-CM

## 2020-12-17 DIAGNOSIS — E785 Hyperlipidemia, unspecified: Secondary | ICD-10-CM

## 2020-12-17 DIAGNOSIS — D696 Thrombocytopenia, unspecified: Secondary | ICD-10-CM

## 2020-12-17 DIAGNOSIS — M816 Localized osteoporosis [Lequesne]: Secondary | ICD-10-CM

## 2020-12-17 DIAGNOSIS — E559 Vitamin D deficiency, unspecified: Secondary | ICD-10-CM

## 2020-12-17 DIAGNOSIS — E039 Hypothyroidism, unspecified: Secondary | ICD-10-CM

## 2020-12-17 DIAGNOSIS — E663 Overweight: Secondary | ICD-10-CM | POA: Diagnosis not present

## 2020-12-17 DIAGNOSIS — F409 Phobic anxiety disorder, unspecified: Secondary | ICD-10-CM

## 2020-12-17 DIAGNOSIS — F5105 Insomnia due to other mental disorder: Secondary | ICD-10-CM

## 2020-12-17 MED ORDER — LEVOTHYROXINE SODIUM 100 MCG PO TABS: 100.0000 ug | ORAL_TABLET | Freq: Every day | ORAL | 1 refills | Status: DC

## 2020-12-17 MED ORDER — PAROXETINE HCL 10 MG PO TABS: ORAL_TABLET | ORAL | 1 refills | Status: DC

## 2020-12-17 NOTE — Patient Instructions (Addendum)
You can use turmeric in capsule form for management of mild joint pain   The treatment dose is 500-600 mg every 12 hours .   The most important OTC medications to minimize use of are NSAIDs (Alevel Motrin)  You can take up to 2000 mg of acetominophen (tylenol) every day safely  In divided doses (500 mg every 6 hours  Or 1000 mg every 12 hours.)  We will repeat all labs in 6 months.    Prolia authorization in progress   Health Maintenance After Age 80 After age 29, you are at a higher risk for certain long-term diseases and infections as well as injuries from falls. Falls are a major cause of broken bones and head injuries in people who are older than age 11. Getting regular preventive care can help to keep you healthy and well. Preventive care includes getting regular testing and making lifestyle changes as recommended by your health care provider. Talk with your health care provider about:  Which screenings and tests you should have. A screening is a test that checks for a disease when you have no symptoms.  A diet and exercise plan that is right for you. What should I know about screenings and tests to prevent falls? Screening and testing are the best ways to find a health problem early. Early diagnosis and treatment give you the best chance of managing medical conditions that are common after age 32. Certain conditions and lifestyle choices may make you more likely to have a fall. Your health care provider may recommend:  Regular vision checks. Poor vision and conditions such as cataracts can make you more likely to have a fall. If you wear glasses, make sure to get your prescription updated if your vision changes.  Medicine review. Work with your health care provider to regularly review all of the medicines you are taking, including over-the-counter medicines. Ask your health care provider about any side effects that may make you more likely to have a fall. Tell your health care provider  if any medicines that you take make you feel dizzy or sleepy.  Osteoporosis screening. Osteoporosis is a condition that causes the bones to get weaker. This can make the bones weak and cause them to break more easily.  Blood pressure screening. Blood pressure changes and medicines to control blood pressure can make you feel dizzy.  Strength and balance checks. Your health care provider may recommend certain tests to check your strength and balance while standing, walking, or changing positions.  Foot health exam. Foot pain and numbness, as well as not wearing proper footwear, can make you more likely to have a fall.  Depression screening. You may be more likely to have a fall if you have a fear of falling, feel emotionally low, or feel unable to do activities that you used to do.  Alcohol use screening. Using too much alcohol can affect your balance and may make you more likely to have a fall. What actions can I take to lower my risk of falls? General instructions  Talk with your health care provider about your risks for falling. Tell your health care provider if: ? You fall. Be sure to tell your health care provider about all falls, even ones that seem minor. ? You feel dizzy, sleepy, or off-balance.  Take over-the-counter and prescription medicines only as told by your health care provider. These include any supplements.  Eat a healthy diet and maintain a healthy weight. A healthy diet includes low-fat dairy  products, low-fat (lean) meats, and fiber from whole grains, beans, and lots of fruits and vegetables. Home safety  Remove any tripping hazards, such as rugs, cords, and clutter.  Install safety equipment such as grab bars in bathrooms and safety rails on stairs.  Keep rooms and walkways well-lit. Activity  Follow a regular exercise program to stay fit. This will help you maintain your balance. Ask your health care provider what types of exercise are appropriate for you.  If  you need a cane or walker, use it as recommended by your health care provider.  Wear supportive shoes that have nonskid soles.   Lifestyle  Do not drink alcohol if your health care provider tells you not to drink.  If you drink alcohol, limit how much you have: ? 0-1 drink a day for women. ? 0-2 drinks a day for men.  Be aware of how much alcohol is in your drink. In the [place] one drink equals one typical bottle of beer (12 oz), one-half glass of wine (5 oz), or one shot of hard liquor (1 oz).  Do not use any products that contain nicotine or tobacco, such as cigarettes and e-cigarettes. If you need help quitting, ask your health care provider. Summary  Having a healthy lifestyle and getting preventive care can help to protect your health and wellness after age 32.  Screening and testing are the best way to find a health problem early and help you avoid having a fall. Early diagnosis and treatment give you the best chance for managing medical conditions that are more common for people who are older than age 65.  Falls are a major cause of broken bones and head injuries in people who are older than age 70. Take precautions to prevent a fall at home.  Work with your health care provider to learn what changes you can make to improve your health and wellness and to prevent falls. This information is not intended to replace advice given to you by your health care provider. Make sure you discuss any questions you have with your health care provider. Document Revised: 01/17/2019 Document Reviewed: 08/09/2017 Elsevier Patient Education  2021 [employer].

## 2020-12-17 NOTE — Assessment & Plan Note (Signed)
Managed Noted in forearm by 2016 DEXA.  There is a family history .  Repeat DEXA reviewed,  Prior trials of alendronate  Was not tolerated due to pill esophagitis ,  evista caused headaches /migraines.  Will rx prolia

## 2020-12-17 NOTE — Progress Notes (Signed)
Patient ID: Michelle Butler, female    DOB: Jan 14, 1941  Age: 80 y.o. MRN: [id number]  The patient is here for  Follow up and management of other chronic and acute problems.    CC: The primary encounter diagnosis was Thrombocytopenia ([place]). Diagnoses of Overweight (BMI 25.0-29.9), Hyperlipidemia with target LDL less than 130, Localized osteoporosis without current pathological fracture, Hypothyroidism (acquired), Vitamin D insufficiency, B12 deficiency, and Insomnia due to anxiety and fear were also pertinent to this visit.   GAD: using lorazepam sparingly  Not more than 5 tablets per month for recent symptoms manifesting as insomnia,    triggered by brother's recent hospitalization for Campylobacteria enteritis with acute renal/.liver failure, bacteremia and c dif colitis resulting from recent abx use    Low platelets: noted again on CBC .  No aspirin or famotidine use.   No bruising or bleeding   New onset constipation . Using magnesium QOD   Osteoporosis:  Reviewed DEXA scan,  Calcium intake and vitamin d levels as well as her risk factors for fracture. Discussed options prior trials of alendronate and evista not tolerated and/or contraindicated.  She is interested in starting  prolia  She has noticed a persistent dimpled rough patch  on lower lip that may be early [facility] (previously treated for BCC on nose)  Joint pain:  Discussed natural remedies  turmeric  Fasting labs reviewed cholesterol improved   BMI:  Reviewed weights,  BMI. Her weight has not changed in years.  She exercises daily and follows a mediterranean style diet but craves sweets.  a1c normal.  Low GI diet reviewed and advised   History Ara has no past medical history on file.   She has a past surgical history that includes Tubal ligation (1982); Hernia repair (2013); Appendectomy (1959); right hip replacement (2000); left hip replacement (2011); Tonsillectomy (1959); and Varicose vein surgery (1991).   Her family history  includes Breast cancer (age of onset: 54) in an other family member; Hip fracture (age of onset: 50) in her father; Melanoma in her brother; Melanoma (age of onset: 84) in her brother; Peripheral Artery Disease (age of onset: 45) in her mother; Pneumonia (age of onset: 61) in her paternal grandfather; Stroke (age of onset: 59) in her paternal grandmother; Stroke (age of onset: 84) in her maternal grandfather.She reports that she has never smoked. She has never used smokeless tobacco. She reports current alcohol use of about 2.0 standard drinks of alcohol per week. She reports that she does not use drugs.  Outpatient Medications Prior to Visit  Medication Sig Dispense Refill  . carboxymethylcellul-glycerin (REFRESH OPTIVE) 0.5-0.9 % ophthalmic solution 1 drop.    . cholecalciferol (VITAMIN D3) 25 MCG (1000 UNIT) tablet Take 1,000 Units by mouth every other day.    Marland Kitchen LORazepam (ATIVAN) 1 MG tablet TAKE 1 TABLET BY MOUTH AT BEDTIME AS NEEDED FOR ANXIETY 30 tablet 5  . Magnesium 250 MG TABS Take 1 tablet by mouth every other day.    . metroNIDAZOLE (METROCREAM) 0.75 % cream Apply 1 application topically 2 (two) times daily.    . Multiple Vitamin (MULTIVITAMIN) tablet Take 1 tablet by mouth every other day.     . naproxen sodium (ALEVE) 220 MG tablet Take 220 mg by mouth.    . vitamin B-12 (CYANOCOBALAMIN) 1000 MCG tablet Take 1,000 mcg by mouth every other day.    . levothyroxine (SYNTHROID) 100 MCG tablet Take 1 tablet (100 mcg total) by mouth daily. 90 tablet 0  .  PARoxetine (PAXIL) 10 MG tablet TAKE 1 TABLET BY MOUTH EVERY DAY IN THE MORNING 90 tablet 1   No facility-administered medications prior to visit.    Review of Systems   Patient denies headache, fevers, malaise, unintentional weight loss, skin rash, eye pain, sinus congestion and sinus pain, sore throat, dysphagia,  hemoptysis , cough, dyspnea, wheezing, chest pain, palpitations, orthopnea, edema, abdominal pain, nausea, melena, diarrhea,  constipation, flank pain, dysuria, hematuria, urinary  Frequency, nocturia, numbness, tingling, seizures,  Focal weakness, Loss of consciousness,  Tremor, insomnia, depression, anxiety, and suicidal ideation.      Objective:  BP 112/78 (BP Location: Left Arm, Patient Position: Sitting, Cuff Size: Normal)   Pulse 65   Temp 98.2 F (36.8 C) (Oral)   Resp 16   Ht 5' 3.25" (1.607 m)   Wt 157 lb 6.4 oz (71.4 kg)   SpO2 96%   BMI 27.66 kg/m   Physical Exam  General appearance: alert, cooperative and appears stated age Head: Normocephalic, without obvious abnormality, atraumatic Eyes: conjunctivae/corneas clear. PERRL, EOM's intact. Fundi benign. Ears: normal TM's and external ear canals both ears Nose: Nares normal. Septum midline. Mucosa normal. No drainage or sinus tenderness.  Throat: lips, mucosa, and tongue normal; teeth and gums normal Mouth:  Lower lip with tiny normal pigmented lesion with central divet c/w BCC Neck: no adenopathy, no carotid bruit, no JVD, supple, symmetrical, trachea midline and thyroid not enlarged, symmetric, no tenderness/mass/nodules Lungs: clear to auscultation bilaterally Breasts: normal appearance, no masses or tenderness Heart: regular rate and rhythm, S1, S2 normal, no murmur, click, rub or gallop Abdomen: soft, non-tender; bowel sounds normal; no masses,  no organomegaly Extremities: extremities normal, atraumatic, no cyanosis or edema Pulses: 2+ and symmetric Skin: Skin color, texture, turgor normal. No rashes or lesions Neurologic: Alert and oriented X 3, normal strength and tone. Normal symmetric reflexes. Normal coordination and gait.    Assessment & Plan:   Problem List Items Addressed This Visit      Unprioritized   Hyperlipidemia with target LDL less than 130    Using the  [facility] , her 10 yr risk of atherosclerotic events is  7% . No therapy required   Lab Results  Component Value Date   CHOL 186 12/15/2020   HDL 61.30 12/15/2020    LDLCALC 109 (H) 12/15/2020   TRIG 76.0 12/15/2020   CHOLHDL 3 12/15/2020         Relevant Orders   Lipid panel   Insomnia due to anxiety and fear    She uses lorazepam  no more than once or  twice weekly . The risks and benefits of  Chronic  benzodiazepine use were reviewed with patient today including increased risk of sedation if combined with alcohol.        Osteoporosis    Managed Noted in forearm by 2016 DEXA.  There is a family history .  Repeat DEXA reviewed,  Prior trials of alendronate  Was not tolerated due to pill esophagitis ,  evista caused headaches /migraines.  Will rx prolia       B12 deficiency   Relevant Orders   Vitamin B12   Hypothyroidism (acquired)    Normal thyroid function in October on current dose of 100 mcg daily   Lab Results  Component Value Date   TSH 1.00 08/06/2020         Relevant Medications   levothyroxine (SYNTHROID) 100 MCG tablet   Other Relevant Orders   TSH  Vitamin D insufficiency   Relevant Orders   VITAMIN D 25 Hydroxy (Vit-D Deficiency, Fractures)   Overweight (BMI 25.0-29.9)    I have addressed  BMI and recommended wt loss of 10% of body weigh over the next 6 months using a low glycemic index diet and regular exercise a minimum of 5 days per week.        Relevant Orders   Comprehensive metabolic panel    Other Visit Diagnoses    Thrombocytopenia ([place])    -  Primary   Relevant Orders   CBC with Differential/Platelet      I am having Shelly Coss "Doley" maintain her metroNIDAZOLE, carboxymethylcellul-glycerin, naproxen sodium, multivitamin, vitamin B-12, cholecalciferol, LORazepam, Magnesium, levothyroxine, and PARoxetine.  Meds ordered this encounter  Medications  . levothyroxine (SYNTHROID) 100 MCG tablet    Sig: Take 1 tablet (100 mcg total) by mouth daily.    Dispense:  90 tablet    Refill:  1    NOTE LOWER DOSE  . PARoxetine (PAXIL) 10 MG tablet    Sig: TAKE 1 TABLET BY MOUTH EVERY DAY IN THE MORNING     Dispense:  90 tablet    Refill:  1    Medications Discontinued During This Encounter  Medication Reason  . PARoxetine (PAXIL) 10 MG tablet Reorder  . levothyroxine (SYNTHROID) 100 MCG tablet Reorder   A total of 40 minutes was spent with patient more than half of which was spent in counseling patient on the above mentioned issues , reviewing and explaining recent labs and imaging studies done, and coordination of care.  Follow-up: Return in about 6 months (around 06/19/2021).   Crecencio Mc, MD

## 2020-12-17 NOTE — Assessment & Plan Note (Signed)
Using the  [facility] , her 10 yr risk of atherosclerotic events is  7% . No therapy required   Lab Results  Component Value Date   CHOL 186 12/15/2020   HDL 61.30 12/15/2020   LDLCALC 109 (H) 12/15/2020   TRIG 76.0 12/15/2020   CHOLHDL 3 12/15/2020

## 2020-12-17 NOTE — Assessment & Plan Note (Signed)
I have addressed  BMI and recommended wt loss of 10% of body weigh over the next 6 months using a low glycemic index diet and regular exercise a minimum of 5 days per week.   

## 2020-12-19 DIAGNOSIS — D696 Thrombocytopenia, unspecified: Secondary | ICD-10-CM | POA: Insufficient documentation

## 2020-12-19 NOTE — Assessment & Plan Note (Signed)
Mild, stable without anemia or leukopenia.  Discussed workup,  Which she has requested to defer unless platelets decline.  Repeat in 6 months

## 2020-12-19 NOTE — Assessment & Plan Note (Signed)
She uses lorazepam  no more than once or  twice weekly . The risks and benefits of  Chronic  benzodiazepine use were reviewed with patient today including increased risk of sedation if combined with alcohol.

## 2020-12-19 NOTE — Assessment & Plan Note (Signed)
Normal thyroid function in October on current dose of 100 mcg daily   Lab Results  Component Value Date   TSH 1.00 08/06/2020

## 2020-12-22 ENCOUNTER — Encounter: Payer: Self-pay | Admitting: Internal Medicine

## 2020-12-24 ENCOUNTER — Other Ambulatory Visit: Payer: Self-pay | Admitting: Internal Medicine

## 2020-12-24 DIAGNOSIS — Z1231 Encounter for screening mammogram for malignant neoplasm of breast: Secondary | ICD-10-CM

## 2021-01-04 ENCOUNTER — Telehealth: Payer: Self-pay | Admitting: Internal Medicine

## 2021-01-04 NOTE — Telephone Encounter (Signed)
Verification for Prolia  ID# [id number]

## 2021-01-07 NOTE — Telephone Encounter (Signed)
PA sent [employer] ion cover my meds.

## 2021-01-11 NOTE — Telephone Encounter (Signed)
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

## 2021-01-11 NOTE — Telephone Encounter (Signed)
Letter in Quick sign and can we add to problem list DDD DX code and high risk for fracture due to her FRAX score I calculated she is at 48% to have major fracture in the next 10 years.

## 2021-01-11 NOTE — Telephone Encounter (Signed)
Agree.  Clinical pharmacist needs to know that those sites cannot be tested and forearm T score is -2.8

## 2021-01-14 ENCOUNTER — Ambulatory Visit
Admission: RE | Admit: 2021-01-14 | Discharge: 2021-01-14 | Disposition: A | Discharge status: Home / Self Care | Payer: Medicare HMO | Source: Ambulatory Visit | Attending: Internal Medicine | Admitting: Internal Medicine

## 2021-01-14 ENCOUNTER — Other Ambulatory Visit: Payer: Self-pay

## 2021-01-14 DIAGNOSIS — Z1231 Encounter for screening mammogram for malignant neoplasm of breast: Secondary | ICD-10-CM | POA: Insufficient documentation

## 2021-01-14 NOTE — Telephone Encounter (Signed)
I have filed the appeal for MS. Stammer , but the appeal Process can take up to 90 days.

## 2021-01-18 NOTE — Telephone Encounter (Signed)
Patient Appeal approved Case number [id number]  Prolia approved from 01/15/21 until 01/15/2022 X 2 Code 20802.

## 2021-01-20 ENCOUNTER — Other Ambulatory Visit: Payer: Self-pay

## 2021-01-20 ENCOUNTER — Encounter: Payer: Self-pay | Admitting: Family

## 2021-01-20 ENCOUNTER — Ambulatory Visit: Payer: Medicare HMO

## 2021-01-20 ENCOUNTER — Telehealth (INDEPENDENT_AMBULATORY_CARE_PROVIDER_SITE_OTHER): Payer: Medicare HMO | Admitting: Family

## 2021-01-20 VITALS — BP 108/62 | HR 76 | Temp 97.6°F | Ht 63.25 in | Wt 156.8 lb

## 2021-01-20 DIAGNOSIS — L249 Irritant contact dermatitis, unspecified cause: Secondary | ICD-10-CM

## 2021-01-20 DIAGNOSIS — L259 Unspecified contact dermatitis, unspecified cause: Secondary | ICD-10-CM | POA: Insufficient documentation

## 2021-01-20 MED ORDER — MUPIROCIN 2 % EX OINT: 1.0000 "application " | TOPICAL_OINTMENT | Freq: Two times a day (BID) | CUTANEOUS | 1 refills | Status: DC

## 2021-01-20 MED ORDER — PREDNISONE 10 MG PO TABS
ORAL_TABLET | ORAL | 0 refills | Status: DC
Start: 2021-01-20 — End: 2021-04-27

## 2021-01-20 NOTE — Patient Instructions (Signed)
Start prednisone tomorrow Start bactroban today to cover for bacterial infection from itching, breaks in skin  Let me know if you need anything at all

## 2021-01-20 NOTE — Progress Notes (Signed)
Subjective:    Patient ID: Michelle Butler, female    DOB: Nov 20, 1940, 80 y.o.   MRN: [id number]  CC: Michelle Butler is a 80 y.o. female who presents today for an acute visit.    HPI: Acute visit  CC pruritic rash bilateral forearms with new lesions to chest and abdomen x 8 days, unchanged. Started after working in yard. She noted an ant hill however was aware if any bites.  Itchy. Raised clear blister like lesions which turn into scabs  Used anti itch cream with relief.  No one else in household has rash.  No fever, chills, nausea, vomiting, oral lesions, trouble breathing.  H/o mrsa.  No new medications, lotions, creams.   Otherwise patient feels well.    HISTORY:  History reviewed. No pertinent past medical history. Past Surgical History:  Procedure Laterality Date  . APPENDECTOMY  1959  . HERNIA REPAIR  2013   Right side with mesh  . left hip replacement  2011  . right hip replacement  2000  . TONSILLECTOMY  1959  . TUBAL LIGATION  1982  . VARICOSE VEIN SURGERY  1991   Family History  Problem Relation Age of Onset  . Peripheral Artery Disease Mother 36  . Hip fracture Father 95  . Melanoma Brother 84  . Stroke Maternal Grandfather 71  . Stroke Paternal Grandmother 35  . Pneumonia Paternal Grandfather 44  . Melanoma Brother   . Breast cancer Other 60       [place]    Allergies: Citalopram and Duloxetine Current Outpatient Medications on File Prior to Visit  Medication Sig Dispense Refill  . carboxymethylcellul-glycerin (REFRESH OPTIVE) 0.5-0.9 % ophthalmic solution 1 drop.    . cholecalciferol (VITAMIN D3) 25 MCG (1000 UNIT) tablet Take 1,000 Units by mouth every other day.    . levothyroxine (SYNTHROID) 100 MCG tablet Take 1 tablet (100 mcg total) by mouth daily. 90 tablet 1  . LORazepam (ATIVAN) 1 MG tablet TAKE 1 TABLET BY MOUTH AT BEDTIME AS NEEDED FOR ANXIETY 30 tablet 5  . Magnesium 250 MG TABS Take 1 tablet by mouth every other day.    .  metroNIDAZOLE (METROCREAM) 0.75 % cream Apply 1 application topically 2 (two) times daily.    . Multiple Vitamin (MULTIVITAMIN) tablet Take 1 tablet by mouth every other day.     . naproxen sodium (ALEVE) 220 MG tablet Take 220 mg by mouth.    Marland Kitchen PARoxetine (PAXIL) 10 MG tablet TAKE 1 TABLET BY MOUTH EVERY DAY IN THE MORNING 90 tablet 1  . vitamin B-12 (CYANOCOBALAMIN) 1000 MCG tablet Take 1,000 mcg by mouth every other day.     No current facility-administered medications on file prior to visit.    Social History   Tobacco Use  . Smoking status: Never Smoker  . Smokeless tobacco: Never Used  Substance Use Topics  . Alcohol use: Yes    Alcohol/week: 2.0 standard drinks    Types: 2 Glasses of wine per week  . Drug use: Never    Review of Systems  Constitutional: Negative for chills and fever.  Respiratory: Negative for cough.   Cardiovascular: Negative for chest pain and palpitations.  Gastrointestinal: Negative for nausea and vomiting.  Musculoskeletal: Negative for arthralgias.  Skin: Positive for rash.      Objective:    BP 108/62   Pulse 76   Temp 97.6 F (36.4 C)   Ht 5' 3.25" (1.607 m)   Wt 156 lb 12.8 oz (71.1  kg)   SpO2 98%   BMI 27.56 kg/m    Physical Exam Vitals reviewed.  Constitutional:      Appearance: She is well-developed.  Eyes:     Conjunctiva/sclera: Conjunctivae normal.  Cardiovascular:     Rate and Rhythm: Normal rate and regular rhythm.     Pulses: Normal pulses.     Heart sounds: Normal heart sounds.  Pulmonary:     Effort: Pulmonary effort is normal.     Breath sounds: Normal breath sounds. No wheezing, rhonchi or rales.  Skin:    General: Skin is warm and dry.     Findings: Rash present. Rash is papular.          Comments: Localized area of scattered, non confluent papules to scab lesions the size of pencil eraser right medial forearm. Non purulent. Surrounding erythema. No increased warmth, non fluctuant.  Pin point scabbed over  lesions isolated to left medial forearm and approx 5 spread of the chest.   Neurological:     Mental Status: She is alert.  Psychiatric:        Speech: Speech normal.        Behavior: Behavior normal.        Thought Content: Thought content normal.        Assessment & Plan:   Problem List Items Addressed This Visit      Musculoskeletal and Integument   Contact dermatitis - Primary    Contact dermatitis suspected from plant allergen. Patient non toxic in appearance and afebrile. Opted for oral prednisone versus topical as appears rash is spread through contact. To cover for bacterial infection from excoriations, cover with bactroban as well. She will let me know how she is doing.      Relevant Medications   predniSONE (DELTASONE) 10 MG tablet   mupirocin ointment (BACTROBAN) 2 %         I am having Michelle Toohey "[place]" start on predniSONE and mupirocin ointment. I am also having her maintain her metroNIDAZOLE, carboxymethylcellul-glycerin, naproxen sodium, multivitamin, vitamin B-12, cholecalciferol, LORazepam, Magnesium, levothyroxine, and PARoxetine.   Meds ordered this encounter  Medications  . predniSONE (DELTASONE) 10 MG tablet    Sig: Take 40 mg by mouth on day 1, then taper 10 mg daily until gone    Dispense:  10 tablet    Refill:  0    Order Specific Question:   Supervising Provider    Answer:   Deborra Medina L [[id number]]  . mupirocin ointment (BACTROBAN) 2 %    Sig: Apply 1 application topically 2 (two) times daily.    Dispense:  22 g    Refill:  1    Order Specific Question:   Supervising Provider    Answer:   Crecencio Mc [[id number]]    Return precautions given.   Risks, benefits, and alternatives of the medications and treatment plan prescribed today were discussed, and patient expressed understanding.   Education regarding symptom management and diagnosis given to patient on AVS.  Continue to follow with Crecencio Mc, MD for routine health  maintenance.   Michelle Butler and I agreed with plan.   Mable Paris, FNP

## 2021-01-21 NOTE — Assessment & Plan Note (Signed)
Contact dermatitis suspected from plant allergen. Patient non toxic in appearance and afebrile. Opted for oral prednisone versus topical as appears rash is spread through contact. To cover for bacterial infection from excoriations, cover with bactroban as well. She will let me know how she is doing.

## 2021-03-04 ENCOUNTER — Ambulatory Visit (INDEPENDENT_AMBULATORY_CARE_PROVIDER_SITE_OTHER): Payer: Medicare HMO

## 2021-03-04 ENCOUNTER — Other Ambulatory Visit: Payer: Self-pay

## 2021-03-04 DIAGNOSIS — M19041 Primary osteoarthritis, right hand: Secondary | ICD-10-CM | POA: Diagnosis not present

## 2021-03-04 DIAGNOSIS — M816 Localized osteoporosis [Lequesne]: Secondary | ICD-10-CM

## 2021-03-04 DIAGNOSIS — M19042 Primary osteoarthritis, left hand: Secondary | ICD-10-CM

## 2021-03-04 MED ORDER — DENOSUMAB 60 MG/ML ~~LOC~~ SOSY
60.0000 mg | PREFILLED_SYRINGE | Freq: Once | SUBCUTANEOUS | Status: AC
  Administered 2021-03-04: 60 mg via SUBCUTANEOUS

## 2021-03-04 NOTE — Progress Notes (Signed)
Patient presented for 6-month Prolia injection SQ to left arm. Patient tolerated well. 

## 2021-04-27 ENCOUNTER — Encounter: Payer: Self-pay | Admitting: Family

## 2021-04-27 ENCOUNTER — Ambulatory Visit (INDEPENDENT_AMBULATORY_CARE_PROVIDER_SITE_OTHER): Payer: Medicare HMO | Admitting: Family

## 2021-04-27 ENCOUNTER — Other Ambulatory Visit: Payer: Self-pay

## 2021-04-27 ENCOUNTER — Ambulatory Visit (INDEPENDENT_AMBULATORY_CARE_PROVIDER_SITE_OTHER): Payer: Medicare HMO

## 2021-04-27 VITALS — BP 122/78 | HR 68 | Temp 97.8°F | Ht 63.27 in | Wt 157.4 lb

## 2021-04-27 DIAGNOSIS — R1031 Right lower quadrant pain: Secondary | ICD-10-CM

## 2021-04-27 DIAGNOSIS — M79604 Pain in right leg: Secondary | ICD-10-CM | POA: Diagnosis not present

## 2021-04-27 DIAGNOSIS — M5416 Radiculopathy, lumbar region: Secondary | ICD-10-CM | POA: Diagnosis not present

## 2021-04-27 MED ORDER — PREDNISONE 10 MG (21) PO TBPK: ORAL_TABLET | ORAL | 0 refills | Status: DC

## 2021-04-27 NOTE — Patient Instructions (Signed)
  Radicular Pain Radicular pain is a type of pain that spreads from your back or neck along a spinal nerve. Spinal nerves are nerves that leave the spinal cord and go to the muscles. Radicular pain is sometimes called radiculopathy, radiculitis, or a pinched nerve. When you have this type of pain, you may also have weakness, numbness, or tingling in the area of your body that is supplied by the nerve. The pain may feel sharp and burning. Depending on which spinal nerve is affected, the pain may occur in the: Neck area (cervical radicular pain). You may also feel pain, numbness, weakness, or tingling in the arms. Mid-spine area (thoracic radicular pain). You would feel this pain in the back and chest. This type is rare. Lower back area (lumbar radicular pain). You would feel this pain as low back pain. You may feel pain, numbness, weakness, or tingling in the buttocks or legs. Sciatica is a type of lumbar radicular pain that shoots down the back of the leg. Radicular pain occurs when one of the spinal nerves becomes irritated or squeezed (compressed). It is often caused by something pushing on a spinal nerve, such as one of the bones of the spine (vertebrae) or one of the round cushions between vertebrae (intervertebral disks). This can result from: An injury. Wear and tear or aging of a disk. The growth of a bone spur that pushes on the nerve. Radicular pain often goes away when you follow instructions from your healthcare provider for relieving pain at home. Follow these instructions at home: Managing pain     If directed, put ice on the affected area: Put ice in a plastic bag. Place a towel between your skin and the bag. Leave the ice on for 20 minutes, 2-3 times a day. If directed, apply heat to the affected area as often as told by your health care provider. Use the heat source that your health care provider recommends, such as a moist heat pack or a heating pad. Place a towel between  your skin and the heat source. Leave the heat on for 20-30 minutes. Remove the heat if your skin turns bright red. This is especially important if you are unable to feel pain, heat, or cold. You may have a greater risk of getting burned. Activity  Do not sit or rest in bed for long periods of time. Try to stay as active as possible. Ask your health care provider what type of exercise or activity is best for you. Avoid activities that make your pain worse, such as bending and lifting. Do not lift anything that is heavier than 10 lb (4.5 kg), or the limit that you are told, until your health care provider says that it is safe. Practice using proper technique when lifting items. Proper lifting technique involves bending your knees and rising up. Do strength and range-of-motion exercises only as told by your health care provider or physical therapist.  General instructions Take over-the-counter and prescription medicines only as told by your health care provider. Pay attention to any changes in your symptoms. Keep all follow-up visits as told by your health care provider. This is important. Your health care provider may send you to a physical therapist to help with this pain. Contact a health care provider if: Your pain and other symptoms get worse. Your pain medicine is not helping. Your pain has not improved after a few weeks of home care. You have a fever. Get help right away if: You have   severe pain, weakness, or numbness. You have difficulty with bladder or bowel control. Summary Radicular pain is a type of pain that spreads from your back or neck along a spinal nerve. When you have radicular pain, you may also have weakness, numbness, or tingling in the area of your body that is supplied by the nerve. The pain may feel sharp or burning. Radicular pain may be treated with ice, heat, medicines, or physical therapy. This information is not intended to replace advice given to you by your  health care provider. Make sure you discuss any questions you have with your healthcare provider. Document Revised: 04/10/2018 Document Reviewed: 04/10/2018 Elsevier Patient Education  2022 Elsevier Inc.  

## 2021-04-28 ENCOUNTER — Encounter: Payer: Self-pay | Admitting: Family

## 2021-04-28 NOTE — Progress Notes (Signed)
Acute Office Visit  Subjective:    Patient ID: Michelle Butler, female    DOB: 10-Aug-1941, 80 y.o.   MRN: [id number]  Chief Complaint  Patient presents with  . Pain    Pt states she is having pain on her right side in her groin area. Pt states pain started 6-8 weeks ago. Pt states pain comes and goes.    HPI Patient is in today with concerns or right sided groin pain x 6-8 weeks radiates down the front/back of the left leg. Reports she has been using massage and stretch to help with symptoms. Pain 8/10. Worse with bending. She has been taking OTC Aleve that helps some. Pain waxes and wanes in intensity. Walks 2 miles per day. She has a history of appendectomy.   No past medical history on file.  Past Surgical History:  Procedure Laterality Date  . APPENDECTOMY  1959  . HERNIA REPAIR  2013   Right side with mesh  . left hip replacement  2011  . right hip replacement  2000  . TONSILLECTOMY  1959  . TUBAL LIGATION  1982  . VARICOSE VEIN SURGERY  1991    Family History  Problem Relation Age of Onset  . Peripheral Artery Disease Mother 14  . Hip fracture Father 95  . Melanoma Brother 22  . Stroke Maternal Grandfather 65  . Stroke Paternal Grandmother 6  . Pneumonia Paternal Grandfather 61  . Melanoma Brother   . Breast cancer Other 17       [place]    Social History   Socioeconomic History  . Marital status: Married    Spouse name: Not on file  . Number of children: Not on file  . Years of education: Not on file  . Highest education level: Not on file  Occupational History  . Not on file  Tobacco Use  . Smoking status: Never  . Smokeless tobacco: Never  Substance and Sexual Activity  . Alcohol use: Yes    Alcohol/week: 2.0 standard drinks    Types: 2 Glasses of wine per week  . Drug use: Never  . Sexual activity: Not on file  Other Topics Concern  . Not on file  Social History Narrative   Married    [place]   Social Determinants of Health    Financial Resource Strain: Low Risk   . Difficulty of Paying Living Expenses: Not hard at all  Food Insecurity: No Food Insecurity  . Worried About Charity fundraiser in the Last Year: Never true  . Ran Out of Food in the Last Year: Never true  Transportation Needs: No Transportation Needs  . Lack of Transportation (Medical): No  . Lack of Transportation (Non-Medical): No  Physical Activity: Not on file  Stress: No Stress Concern Present  . Feeling of Stress : Not at all  Social Connections: Unknown  . Frequency of Communication with Friends and Family: Not on file  . Frequency of Social Gatherings with Friends and Family: Not on file  . Attends Religious Services: Not on file  . Active Member of Clubs or Organizations: Not on file  . Attends Archivist Meetings: Not on file  . Marital Status: Married  Human resources officer Violence: Not At Risk  . Fear of Current or Ex-Partner: No  . Emotionally Abused: No  . Physically Abused: No  . Sexually Abused: No    Outpatient Medications Prior to Visit  Medication Sig Dispense Refill  . carboxymethylcellul-glycerin (  REFRESH OPTIVE) 0.5-0.9 % ophthalmic solution 1 drop.    . cholecalciferol (VITAMIN D3) 25 MCG (1000 UNIT) tablet Take 1,000 Units by mouth every other day.    . levothyroxine (SYNTHROID) 100 MCG tablet Take 1 tablet (100 mcg total) by mouth daily. 90 tablet 1  . LORazepam (ATIVAN) 1 MG tablet TAKE 1 TABLET BY MOUTH AT BEDTIME AS NEEDED FOR ANXIETY 30 tablet 5  . Magnesium 250 MG TABS Take 1 tablet by mouth every other day.    . metroNIDAZOLE (METROCREAM) 0.75 % cream Apply 1 application topically 2 (two) times daily.    . naproxen sodium (ALEVE) 220 MG tablet Take 220 mg by mouth.    Marland Kitchen PARoxetine (PAXIL) 10 MG tablet TAKE 1 TABLET BY MOUTH EVERY DAY IN THE MORNING 90 tablet 1  . vitamin B-12 (CYANOCOBALAMIN) 1000 MCG tablet Take 1,000 mcg by mouth every other day.    . Multiple Vitamin (MULTIVITAMIN) tablet Take 1  tablet by mouth every other day.  (Patient not taking: Reported on 04/27/2021)    . mupirocin ointment (BACTROBAN) 2 % Apply 1 application topically 2 (two) times daily. (Patient not taking: Reported on 04/27/2021) 22 g 1  . predniSONE (DELTASONE) 10 MG tablet Take 40 mg by mouth on day 1, then taper 10 mg daily until gone (Patient not taking: Reported on 04/27/2021) 10 tablet 0   No facility-administered medications prior to visit.    Allergies  Allergen Reactions  . Citalopram Nausea Only  . Duloxetine Nausea Only    Review of Systems  Constitutional: Negative.   Respiratory: Negative.    Cardiovascular: Negative.   Gastrointestinal: Negative.   Endocrine: Negative.   Musculoskeletal:  Positive for back pain.  Skin: Negative.   Allergic/Immunologic: Negative.   Neurological: Negative.        Radiates down the leg  Psychiatric/Behavioral: Negative.    All other systems reviewed and are negative.     Objective:    Physical Exam Vitals reviewed.  Constitutional:      Appearance: Normal appearance.  Cardiovascular:     Rate and Rhythm: Normal rate and regular rhythm.  Pulmonary:     Effort: Pulmonary effort is normal.     Breath sounds: Normal breath sounds.  Abdominal:     General: Abdomen is flat.     Palpations: Abdomen is soft.  Musculoskeletal:        General: Normal range of motion.     Cervical back: Normal range of motion and neck supple.  Skin:    General: Skin is warm and dry.  Neurological:     General: No focal deficit present.     Mental Status: She is alert and oriented to person, place, and time.  Psychiatric:        Mood and Affect: Mood normal.        Behavior: Behavior normal.    BP 122/78   Pulse 68   Temp 97.8 F (36.6 C)   Ht 5' 3.27" (1.607 m)   Wt 157 lb 6.4 oz (71.4 kg)   SpO2 96%   BMI 27.65 kg/m  Wt Readings from Last 3 Encounters:  04/27/21 157 lb 6.4 oz (71.4 kg)  01/20/21 156 lb 12.8 oz (71.1 kg)  12/17/20 157 lb 6.4 oz (71.4  kg)    There are no preventive care reminders to display for this patient.  There are no preventive care reminders to display for this patient.   Lab Results  Component Value Date  TSH 1.00 08/06/2020   Lab Results  Component Value Date   WBC 4.2 12/15/2020   HGB 13.2 12/15/2020   HCT 37.6 12/15/2020   MCV 93.4 12/15/2020   PLT 139.0 (L) 12/15/2020   Lab Results  Component Value Date   NA 138 12/15/2020   K 4.1 12/15/2020   CO2 27 12/15/2020   GLUCOSE 85 12/15/2020   BUN 11 12/15/2020   CREATININE 0.87 12/15/2020   BILITOT 0.7 12/15/2020   ALKPHOS 38 (L) 12/15/2020   AST 24 12/15/2020   ALT 14 12/15/2020   PROT 6.7 12/15/2020   ALBUMIN 4.0 12/15/2020   CALCIUM 9.2 12/15/2020   GFR 63.04 12/15/2020   Lab Results  Component Value Date   CHOL 186 12/15/2020   Lab Results  Component Value Date   HDL 61.30 12/15/2020   Lab Results  Component Value Date   LDLCALC 109 (H) 12/15/2020   Lab Results  Component Value Date   TRIG 76.0 12/15/2020   Lab Results  Component Value Date   CHOLHDL 3 12/15/2020   Lab Results  Component Value Date   HGBA1C 5.4 12/15/2020       Assessment & Plan:   Problem List Items Addressed This Visit   None Visit Diagnoses     Lumbar radiculopathy    -  Primary   Relevant Orders   DG Lumbar Spine Complete (Completed)   Right leg pain       Relevant Orders   DG Lumbar Spine Complete (Completed)   Right groin pain       Relevant Orders   DG Lumbar Spine Complete (Completed)        Meds ordered this encounter  Medications  . predniSONE (STERAPRED UNI-PAK 21 TAB) 10 MG (21) TBPK tablet    Sig: As directed    Dispense:  21 tablet    Refill:  0   Plan: Symptoms consistently Call the office with any questions or concerns.   Kennyth Arnold, FNP

## 2021-06-17 ENCOUNTER — Other Ambulatory Visit: Payer: Medicare HMO

## 2021-06-21 ENCOUNTER — Ambulatory Visit: Payer: Medicare HMO | Admitting: Internal Medicine

## 2021-06-30 ENCOUNTER — Other Ambulatory Visit (INDEPENDENT_AMBULATORY_CARE_PROVIDER_SITE_OTHER): Payer: Medicare HMO

## 2021-06-30 ENCOUNTER — Ambulatory Visit: Payer: Medicare HMO | Admitting: Internal Medicine

## 2021-06-30 ENCOUNTER — Other Ambulatory Visit: Payer: Self-pay

## 2021-06-30 DIAGNOSIS — E538 Deficiency of other specified B group vitamins: Secondary | ICD-10-CM

## 2021-06-30 DIAGNOSIS — E039 Hypothyroidism, unspecified: Secondary | ICD-10-CM | POA: Diagnosis not present

## 2021-06-30 DIAGNOSIS — E663 Overweight: Secondary | ICD-10-CM | POA: Diagnosis not present

## 2021-06-30 DIAGNOSIS — E559 Vitamin D deficiency, unspecified: Secondary | ICD-10-CM

## 2021-06-30 DIAGNOSIS — E785 Hyperlipidemia, unspecified: Secondary | ICD-10-CM | POA: Diagnosis not present

## 2021-06-30 DIAGNOSIS — D696 Thrombocytopenia, unspecified: Secondary | ICD-10-CM | POA: Diagnosis not present

## 2021-06-30 LAB — CBC WITH DIFFERENTIAL/PLATELET
Basophils Absolute: 0 10*3/uL (ref 0.0–0.1)
Basophils Relative: 0.7 % (ref 0.0–3.0)
Eosinophils Absolute: 0.1 10*3/uL (ref 0.0–0.7)
Eosinophils Relative: 2 % (ref 0.0–5.0)
HCT: 38.8 % (ref 36.0–46.0)
Hemoglobin: 12.9 g/dL (ref 12.0–15.0)
Lymphocytes Relative: 32.2 % (ref 12.0–46.0)
Lymphs Abs: 1.3 10*3/uL (ref 0.7–4.0)
MCHC: 33.4 g/dL (ref 30.0–36.0)
MCV: 93.4 fl (ref 78.0–100.0)
Monocytes Absolute: 0.7 10*3/uL (ref 0.1–1.0)
Monocytes Relative: 18.1 % — ABNORMAL HIGH (ref 3.0–12.0)
Neutro Abs: 1.9 10*3/uL (ref 1.4–7.7)
Neutrophils Relative %: 47 % (ref 43.0–77.0)
Platelets: 136 10*3/uL — ABNORMAL LOW (ref 150.0–400.0)
RBC: 4.15 Mil/uL (ref 3.87–5.11)
RDW: 13.8 % (ref 11.5–15.5)
WBC: 4 10*3/uL (ref 4.0–10.5)

## 2021-06-30 LAB — COMPREHENSIVE METABOLIC PANEL
ALT: 13 U/L (ref 0–35)
AST: 17 U/L (ref 0–37)
Albumin: 4 g/dL (ref 3.5–5.2)
Alkaline Phosphatase: 30 U/L — ABNORMAL LOW (ref 39–117)
BUN: 15 mg/dL (ref 6–23)
CO2: 26 mEq/L (ref 19–32)
Calcium: 9 mg/dL (ref 8.4–10.5)
Chloride: 106 mEq/L (ref 96–112)
Creatinine, Ser: 0.85 mg/dL (ref 0.40–1.20)
GFR: 64.58 mL/min (ref >60.00)
Glucose, Bld: 84 mg/dL (ref 70–99)
Potassium: 4.1 mEq/L (ref 3.5–5.1)
Sodium: 138 mEq/L (ref 135–145)
Total Bilirubin: 0.7 mg/dL (ref 0.2–1.2)
Total Protein: 6.5 g/dL (ref 6.0–8.3)

## 2021-06-30 LAB — LIPID PANEL
Cholesterol: 204 mg/dL — ABNORMAL HIGH (ref 0–200)
HDL: 57.8 mg/dL (ref >39.00)
LDL Cholesterol: 123 mg/dL — ABNORMAL HIGH (ref 0–99)
NonHDL: 145.79
Total CHOL/HDL Ratio: 4
Triglycerides: 115 mg/dL (ref 0.0–149.0)
VLDL: 23 mg/dL (ref 0.0–40.0)

## 2021-06-30 LAB — TSH: TSH: 2.03 u[IU]/mL (ref 0.35–5.50)

## 2021-06-30 LAB — VITAMIN D 25 HYDROXY (VIT D DEFICIENCY, FRACTURES): VITD: 22.38 ng/mL — ABNORMAL LOW (ref 30.00–100.00)

## 2021-06-30 LAB — VITAMIN B12: Vitamin B-12: 929 pg/mL — ABNORMAL HIGH (ref 211–911)

## 2021-07-02 ENCOUNTER — Encounter: Payer: Self-pay | Admitting: Internal Medicine

## 2021-07-02 ENCOUNTER — Other Ambulatory Visit: Payer: Self-pay

## 2021-07-02 ENCOUNTER — Ambulatory Visit (INDEPENDENT_AMBULATORY_CARE_PROVIDER_SITE_OTHER): Payer: Medicare HMO | Admitting: Internal Medicine

## 2021-07-02 VITALS — BP 140/74 | HR 60 | Temp 95.9°F | Ht 63.0 in | Wt 161.6 lb

## 2021-07-02 DIAGNOSIS — F5105 Insomnia due to other mental disorder: Secondary | ICD-10-CM

## 2021-07-02 DIAGNOSIS — E559 Vitamin D deficiency, unspecified: Secondary | ICD-10-CM

## 2021-07-02 DIAGNOSIS — F410 Panic disorder [episodic paroxysmal anxiety] without agoraphobia: Secondary | ICD-10-CM

## 2021-07-02 DIAGNOSIS — I708 Atherosclerosis of other arteries: Secondary | ICD-10-CM

## 2021-07-02 DIAGNOSIS — F411 Generalized anxiety disorder: Secondary | ICD-10-CM

## 2021-07-02 DIAGNOSIS — I8393 Asymptomatic varicose veins of bilateral lower extremities: Secondary | ICD-10-CM

## 2021-07-02 DIAGNOSIS — I700 Atherosclerosis of aorta: Secondary | ICD-10-CM

## 2021-07-02 DIAGNOSIS — Z23 Encounter for immunization: Secondary | ICD-10-CM | POA: Diagnosis not present

## 2021-07-02 DIAGNOSIS — F409 Phobic anxiety disorder, unspecified: Secondary | ICD-10-CM

## 2021-07-02 DIAGNOSIS — D696 Thrombocytopenia, unspecified: Secondary | ICD-10-CM

## 2021-07-02 MED ORDER — ERGOCALCIFEROL 1.25 MG (50000 UT) PO CAPS: 50000.0000 [IU] | ORAL_CAPSULE | ORAL | 0 refills | Status: DC

## 2021-07-02 MED ORDER — SERTRALINE HCL 50 MG PO TABS: 50.0000 mg | ORAL_TABLET | Freq: Every day | ORAL | 1 refills | Status: DC

## 2021-07-02 MED ORDER — ATORVASTATIN CALCIUM 20 MG PO TABS: ORAL_TABLET | ORAL | 3 refills | Status: DC

## 2021-07-02 NOTE — Assessment & Plan Note (Signed)
Reviewed findings of prior LUMBAR SPINE FILM today..  Patient is willing to  Initiate statin therpay starting with 20 mg ATORVASTAITN TWICEWEEKLY

## 2021-07-02 NOTE — Patient Instructions (Addendum)
You can use colace and/or a bulk forming laxative daily for your bowels   I will make a Referral to Gen Surg for the hernia.  Ask Michelle Butler who she likes in general surgery   YARDWORK IS LABOR,  NOT EXERCISE!      Your aortic atherosclerosis places you at increased risk for heart attack and stroke. I recommend that you start taking  Lipitor .  Start with one tablet two times weekly  and let me know if you tolerate it.     Your lorazepam will be refilled.   We are changing paxil to sertraline.  50 mg dose.  You may increase dose to 100 mg (or 75 mg ) after 2 weeks if needed

## 2021-07-02 NOTE — Progress Notes (Signed)
Subjective:  Patient ID: Michelle Butler, female    DOB: [birth year]  Age: 80 y.o. MRN: [id number]  CC: The primary encounter diagnosis was Need for immunization against influenza. Diagnoses of Atherosclerosis of aortic bifurcation and common iliac arteries ([place]), Generalized anxiety disorder with panic attacks, Asymptomatic varicose veins of both lower extremities, Thrombocytopenia ([place]), Insomnia due to anxiety and fear, and Vitamin D deficiency were also pertinent to this visit.  HPI [name] presents for follow up on GAD and hypothyroidism  Chief Complaint  Patient presents with   Follow-up    6 month follow up     GAD:  using paxil and lorazepam . Uses lorazepam very rarely during the day.   Usually at night if she wakes up at at midnight  starts with 1/2 dose usually . Has been taking paxil for over ten years.  Previus attmempts to taper resulted in relapse with frequent panic attacks.   several minor panic attacks  last one on Monday after receiving the COVID booster .   Cc: feels mildly depressed "(I',m falling apart).  Recenlty took prednisone  taper for poison ivy.  2) after dancing  all day at nephew's wedding , developed back pain.Marland Kitchen  tried massage,  stretching,  nothing helped until she was given prednisone   2) Joint pain .  Has had 2 episodes of right hand pain.  No inciting event but loves yardwork,  pruning,  digging.  Took naproxen and a low dose of tylenol (500 mg) every 12  hours   3) left ingjuinal bulge without pain . History of right inguinal hernia 2013 with mesh and a "plug" done  outside of local area.    4) varicose veins  up to thigh.  Has surgical stripping  in 1992 , but not the saphenous .  wearing thigh high elastic stockings when they are bothersome   5) aortic atherosclerosis:  willing to try statin therapy 2/week   6) Vit D deficiency:  taking 1000 ius.   Outpatient Medications Prior to Visit  Medication Sig Dispense Refill   acetaminophen  (TYLENOL) 500 MG tablet Take 500 mg by mouth every 6 (six) hours as needed.     Carboxymeth-Glyc-Polysorb PF (REFRESH OPTIVE MEGA-3) 0.5-1-0.5 % SOLN Apply 1 drop to eye daily.     Carboxymethylcellulose Sodium (REFRESH LIQUIGEL OP) Apply 1 drop to eye 2 (two) times daily.     cholecalciferol (VITAMIN D3) 25 MCG (1000 UNIT) tablet Take 1,000 Units by mouth every other day.     levothyroxine (SYNTHROID) 100 MCG tablet Take 1 tablet (100 mcg total) by mouth daily. 90 tablet 1   LORazepam (ATIVAN) 1 MG tablet TAKE 1 TABLET BY MOUTH AT BEDTIME AS NEEDED FOR ANXIETY 30 tablet 5   metroNIDAZOLE (METROCREAM) 0.75 % cream Apply 1 application topically 2 (two) times daily.     naproxen sodium (ALEVE) 220 MG tablet Take 220 mg by mouth.     vitamin B-12 (CYANOCOBALAMIN) 1000 MCG tablet Take 1,000 mcg by mouth every other day.     PARoxetine (PAXIL) 10 MG tablet TAKE 1 TABLET BY MOUTH EVERY DAY IN THE MORNING 90 tablet 1   carboxymethylcellul-glycerin (REFRESH OPTIVE) 0.5-0.9 % ophthalmic solution 1 drop. (Patient not taking: Reported on 07/02/2021)     Magnesium 250 MG TABS Take 1 tablet by mouth every other day.     predniSONE (STERAPRED UNI-PAK 21 TAB) 10 MG (21) TBPK tablet As directed (Patient not taking: Reported on 07/02/2021) 21 tablet 0  No facility-administered medications prior to visit.    Review of Systems;  Patient denies headache, fevers, malaise, unintentional weight loss, skin rash, eye pain, sinus congestion and sinus pain, sore throat, dysphagia,  hemoptysis , cough, dyspnea, wheezing, chest pain, palpitations, orthopnea, edema, abdominal pain, nausea, melena, diarrhea, constipation, flank pain, dysuria, hematuria, urinary  Frequency, nocturia, numbness, tingling, seizures,  Focal weakness, Loss of consciousness,  Tremor, insomnia, depression, anxiety, and suicidal ideation.      Objective:  BP 140/74 (BP Location: Left Arm, Patient Position: Sitting, Cuff Size: Normal)   Pulse 60    Temp (!) 95.9 F (35.5 C) (Temporal)   Ht 5\' 3"  (1.6 m)   Wt 161 lb 9.6 oz (73.3 kg)   SpO2 98%   BMI 28.63 kg/m   BP Readings from Last 3 Encounters:  07/02/21 140/74  04/27/21 122/78  01/20/21 108/62    Wt Readings from Last 3 Encounters:  07/02/21 161 lb 9.6 oz (73.3 kg)  04/27/21 157 lb 6.4 oz (71.4 kg)  01/20/21 156 lb 12.8 oz (71.1 kg)    General appearance: alert, cooperative and appears stated age Ears: normal TM's and external ear canals both ears Throat: lips, mucosa, and tongue normal; teeth and gums normal Neck: no adenopathy, no carotid bruit, supple, symmetrical, trachea midline and thyroid not enlarged, symmetric, no tenderness/mass/nodules Back: symmetric, no curvature. ROM normal. No CVA tenderness. Lungs: clear to auscultation bilaterally Heart: regular rate and rhythm, S1, S2 normal, no murmur, click, rub or gallop Abdomen: soft, non-tender; bowel sounds normal; no masses,  no organomegaly Pulses: 2+ and symmetric Skin: Skin color, texture, turgor normal. No rashes or lesions Lymph nodes: Cervical, supraclavicular, and axillary nodes normal.  Lab Results  Component Value Date   HGBA1C 5.4 12/15/2020    Lab Results  Component Value Date   CREATININE 0.85 06/30/2021   CREATININE 0.87 12/15/2020   CREATININE 0.90 06/24/2020    Lab Results  Component Value Date   WBC 4.0 06/30/2021   HGB 12.9 06/30/2021   HCT 38.8 06/30/2021   PLT 136.0 (L) 06/30/2021   GLUCOSE 84 06/30/2021   CHOL 204 (H) 06/30/2021   TRIG 115.0 06/30/2021   HDL 57.80 06/30/2021   LDLCALC 123 (H) 06/30/2021   ALT 13 06/30/2021   AST 17 06/30/2021   NA 138 06/30/2021   K 4.1 06/30/2021   CL 106 06/30/2021   CREATININE 0.85 06/30/2021   BUN 15 06/30/2021   CO2 26 06/30/2021   TSH 2.03 06/30/2021   HGBA1C 5.4 12/15/2020    MM 3D SCREEN BREAST BILATERAL  Result Date: 01/15/2021 CLINICAL DATA:  Screening. EXAM: DIGITAL SCREENING BILATERAL MAMMOGRAM WITH TOMOSYNTHESIS AND  CAD TECHNIQUE: Bilateral screening digital craniocaudal and mediolateral oblique mammograms were obtained. Bilateral screening digital breast tomosynthesis was performed. The images were evaluated with computer-aided detection. COMPARISON:  Previous exam(s). ACR Breast Density Category b: There are scattered areas of fibroglandular density. FINDINGS: There are no findings suspicious for malignancy. The images were evaluated with computer-aided detection. IMPRESSION: No mammographic evidence of malignancy. A result letter of this screening mammogram will be mailed directly to the patient. RECOMMENDATION: Screening mammogram in one year. (Code:SM-B-01Y) BI-RADS CATEGORY  1: Negative. Electronically Signed   By: Margarette Canada M.D.   On: 01/15/2021 16:41    Assessment & Plan:   Problem List Items Addressed This Visit       Unprioritized   Atherosclerosis of aortic bifurcation and common iliac arteries ([place])    Reviewed findings of  prior LUMBAR SPINE FILM today..  Patient is willing to  Initiate statin therpay starting with 20 mg ATORVASTAITN TWICEWEEKLY       Relevant Medications   atorvastatin (LIPITOR) 20 MG tablet   Generalized anxiety disorder with panic attacks    Present for over a decade, managed with paroxetine. Discussed change to sertraline for management of depressive symptoms.  Reassurance provided that there are no serious long term consequences of SSRI use       Relevant Medications   sertraline (ZOLOFT) 50 MG tablet   Insomnia due to anxiety and fear    She uses lorazepam  no more than once or  twice weekly . The risks and benefits of  Chronic  benzodiazepine use were reviewed with patient today including increased risk of sedation if combined with alcohol.        Thrombocytopenia ([facility])    Mild, stable without anemia or leukopenia.  Discussed workup,  Which she has requested to defer unless platelets decline.  Platelets are stable   Lab Results  Component Value Date   WBC 4.0  06/30/2021   HGB 12.9 06/30/2021   HCT 38.8 06/30/2021   MCV 93.4 06/30/2021   PLT 136.0 (L) 06/30/2021         Varicose veins of both lower extremities    With prior stripping (not including the saphenous vein).  encouraged to exercise,  Wear thigh high  stockings       Relevant Medications   atorvastatin (LIPITOR) 20 MG tablet   Vitamin D deficiency    Treating with megadose for 4 weeks       Other Visit Diagnoses     Need for immunization against influenza    -  Primary   Relevant Orders   Flu Vaccine QUAD High Dose(Fluad) (Completed)       I have discontinued Michelle Butler "Doley"'s carboxymethylcellul-glycerin, Magnesium, PARoxetine, and predniSONE. I am also having her start on sertraline, atorvastatin, and ergocalciferol. Additionally, I am having her maintain her metroNIDAZOLE, naproxen sodium, vitamin B-12, cholecalciferol, LORazepam, levothyroxine, Carboxymethylcellulose Sodium (REFRESH LIQUIGEL OP), Refresh Optive Mega-3, and acetaminophen.  Meds ordered this encounter  Medications   sertraline (ZOLOFT) 50 MG tablet    Sig: Take 1 tablet (50 mg total) by mouth daily.    Dispense:  90 tablet    Refill:  1   atorvastatin (LIPITOR) 20 MG tablet    Sig: One tablet by mouth two times weekly    Dispense:  24 tablet    Refill:  3   ergocalciferol (DRISDOL) 1.25 MG (50000 UT) capsule    Sig: Take 1 capsule (50,000 Units total) by mouth once a week.    Dispense:  4 capsule    Refill:  0    Medications Discontinued During This Encounter  Medication Reason   Magnesium 250 MG TABS    carboxymethylcellul-glycerin (REFRESH OPTIVE) 0.5-0.9 % ophthalmic solution    predniSONE (STERAPRED UNI-PAK 21 TAB) 10 MG (21) TBPK tablet    PARoxetine (PAXIL) 10 MG tablet     Follow-up: No follow-ups on file.   Crecencio Mc, MD

## 2021-07-04 DIAGNOSIS — I8393 Asymptomatic varicose veins of bilateral lower extremities: Secondary | ICD-10-CM | POA: Insufficient documentation

## 2021-07-04 NOTE — Assessment & Plan Note (Signed)
With prior stripping (not including the saphenous vein).  encouraged to exercise,  Wear thigh high  stockings

## 2021-07-04 NOTE — Assessment & Plan Note (Addendum)
Present for over a decade, managed with paroxetine. Discussed change to sertraline for management of depressive symptoms.  Reassurance provided that there are no serious long term consequences of SSRI use

## 2021-07-04 NOTE — Assessment & Plan Note (Signed)
She uses lorazepam  no more than once or  twice weekly . The risks and benefits of  Chronic  benzodiazepine use were reviewed with patient today including increased risk of sedation if combined with alcohol.

## 2021-07-04 NOTE — Assessment & Plan Note (Signed)
Mild, stable without anemia or leukopenia.  Discussed workup,  Which she has requested to defer unless platelets decline.  Platelets are stable   Lab Results  Component Value Date   WBC 4.0 06/30/2021   HGB 12.9 06/30/2021   HCT 38.8 06/30/2021   MCV 93.4 06/30/2021   PLT 136.0 (L) 06/30/2021

## 2021-07-04 NOTE — Assessment & Plan Note (Signed)
Treating with megadose for 4 weeks

## 2021-07-05 ENCOUNTER — Telehealth: Payer: Self-pay | Admitting: Internal Medicine

## 2021-07-05 NOTE — Telephone Encounter (Signed)
Authorization in process due  09/04/21.

## 2021-07-12 DIAGNOSIS — K4090 Unilateral inguinal hernia, without obstruction or gangrene, not specified as recurrent: Secondary | ICD-10-CM

## 2021-07-13 ENCOUNTER — Other Ambulatory Visit: Payer: Self-pay | Admitting: Internal Medicine

## 2021-07-16 DIAGNOSIS — K4090 Unilateral inguinal hernia, without obstruction or gangrene, not specified as recurrent: Secondary | ICD-10-CM | POA: Insufficient documentation

## 2021-07-16 NOTE — Assessment & Plan Note (Signed)
Noted on sept 23 exam.  Referral to Gen Surg requested

## 2021-07-16 NOTE — Addendum Note (Signed)
Addended by: Crecencio Mc on: 07/16/2021 01:31 PM   Modules accepted: Orders

## 2021-07-23 ENCOUNTER — Other Ambulatory Visit: Payer: Self-pay | Admitting: Internal Medicine

## 2021-07-27 NOTE — Telephone Encounter (Signed)
Do not refill megadose D vitamin.  Patient should now be taing 2000 Ius daily of the otc variety

## 2021-07-29 NOTE — Telephone Encounter (Signed)
Called to speak with [facility]. Sharda agreed to taking 2000 units and states that she did not request a refill of Megadose D vitamin.

## 2021-08-09 NOTE — Telephone Encounter (Signed)
PA started on Cover My meds Contact plan to follow up on [facility]

## 2021-08-17 NOTE — Telephone Encounter (Signed)
Tried to call must register online have completed this waiting for email confirmation.

## 2021-08-17 NOTE — Telephone Encounter (Signed)
She is taking a mega dose of vitamin d

## 2021-08-17 NOTE — Telephone Encounter (Signed)
I have patient Michelle Butler due for 09/04/21 approved but last Vit D was 22.38 on 06/30/21 and her calcium normal at 9.0 , Alkaline phosphatase 30 okay to give Michelle Butler?

## 2021-08-18 NOTE — Telephone Encounter (Signed)
Scheduled

## 2021-09-06 ENCOUNTER — Other Ambulatory Visit: Payer: Self-pay

## 2021-09-06 ENCOUNTER — Ambulatory Visit (INDEPENDENT_AMBULATORY_CARE_PROVIDER_SITE_OTHER): Payer: Medicare HMO

## 2021-09-06 DIAGNOSIS — M816 Localized osteoporosis [Lequesne]: Secondary | ICD-10-CM

## 2021-09-06 MED ORDER — DENOSUMAB 60 MG/ML ~~LOC~~ SOSY
60.0000 mg | PREFILLED_SYRINGE | SUBCUTANEOUS | Status: AC
  Administered 2021-09-06 – 2023-09-11 (×3): 60 mg via SUBCUTANEOUS

## 2021-09-06 NOTE — Progress Notes (Signed)
Patient presenting for what she states is second Prolia injection. No history of allergic reaction to this injection.   Prolia given to Patient in back of the upper part of the left arm. Patient tolerated injection well. Informed to contact the office should any symptoms arise.

## 2021-09-27 ENCOUNTER — Encounter: Payer: Self-pay | Admitting: Internal Medicine

## 2021-09-28 MED ORDER — LORAZEPAM 1 MG PO TABS: ORAL_TABLET | ORAL | 2 refills | Status: DC

## 2021-09-29 ENCOUNTER — Other Ambulatory Visit: Payer: Self-pay | Admitting: General Surgery

## 2021-09-29 NOTE — Progress Notes (Addendum)
x

## 2021-10-01 ENCOUNTER — Encounter: Payer: Self-pay | Admitting: Internal Medicine

## 2021-10-01 MED ORDER — SERTRALINE HCL 50 MG PO TABS: 50.0000 mg | ORAL_TABLET | Freq: Every day | ORAL | 1 refills | Status: DC

## 2021-10-01 MED ORDER — ATORVASTATIN CALCIUM 20 MG PO TABS: ORAL_TABLET | ORAL | 3 refills | Status: DC

## 2021-10-08 ENCOUNTER — Encounter
Admission: RE | Admit: 2021-10-08 | Discharge: 2021-10-08 | Disposition: A | Discharge status: Home / Self Care | Payer: Medicare HMO | Source: Ambulatory Visit | Attending: General Surgery | Admitting: General Surgery

## 2021-10-08 ENCOUNTER — Other Ambulatory Visit: Payer: Self-pay

## 2021-10-08 HISTORY — DX: Hyperlipidemia, unspecified: E78.5

## 2021-10-08 HISTORY — DX: Gastro-esophageal reflux disease without esophagitis: K21.9

## 2021-10-08 HISTORY — DX: Hypothyroidism, unspecified: E03.9

## 2021-10-08 HISTORY — DX: Malignant (primary) neoplasm, unspecified: C80.1

## 2021-10-08 HISTORY — DX: Anxiety disorder, unspecified: F41.9

## 2021-10-08 NOTE — Patient Instructions (Signed)
Your procedure is scheduled on:10-15-21 Friday Report to the Registration Desk on the 1st floor of the [facility].Then proceed to the 2nd floor Surgery Desk in the [facility] To find out your arrival time, please call [phone] between 1PM - 3PM on:10-14-21 Thursday  REMEMBER: Instructions that are not followed completely may result in serious medical risk, up to and including death; or upon the discretion of your surgeon and anesthesiologist your surgery may need to be rescheduled.  Do not eat food after midnight the night before surgery.  No gum chewing, lozengers or hard candies.  You may however, drink CLEAR liquids up to 2 hours before you are scheduled to arrive for your surgery. Do not drink anything within 2 hours of your scheduled arrival time.  Clear liquids include: - water  - apple juice without pulp - gatorade (not RED, PURPLE, OR BLUE) - black coffee or tea (Do NOT add milk or creamers to the coffee or tea) Do NOT drink anything that is not on this list.  TAKE THESE MEDICATIONS THE MORNING OF SURGERY WITH A SIP OF WATER: -levothyroxine (SYNTHROID)  -sertraline (ZOLOFT)  -You may take LORazepam (ATIVAN) if needed the morning of surgery for anxiety  One week prior to surgery: Stop Anti-inflammatories (NSAIDS) such as Advil, Aleve, Ibuprofen, Motrin, Naproxen, Naprosyn and Aspirin based products such as Excedrin, Goodys Powder, BC Powder.You may however, continue to take Tylenol if needed for pain up until the day of surgery.  Stop ANY OVER THE COUNTER supplements/vitamins NOW (10-08-21) until after surgery (vitamin B-12 (CYANOCOBALAMIN) and cholecalciferol (VITAMIN D3)  No Alcohol for 24 hours before or after surgery.  No Smoking including e-cigarettes for 24 hours prior to surgery.  No chewable tobacco products for at least 6 hours prior to surgery.  No nicotine patches on the day of surgery.  Do not use any "recreational" drugs for at least a week prior to your  surgery.  Please be advised that the combination of cocaine and anesthesia may have negative outcomes, up to and including death. If you test positive for cocaine, your surgery will be cancelled.  On the morning of surgery brush your teeth with toothpaste and water, you may rinse your mouth with mouthwash if you wish. Do not swallow any toothpaste or mouthwash.  Use CHG Soap as directed on instruction sheet.  Do not wear jewelry, make-up, hairpins, clips or nail polish.  Do not wear lotions, powders, or perfumes.   Do not shave body from the neck down 48 hours prior to surgery just in case you cut yourself which could leave a site for infection.  Also, freshly shaved skin may become irritated if using the CHG soap.  Contact lenses, hearing aids and dentures may not be worn into surgery.  Do not bring valuables to the hospital. [facility] is not responsible for any missing/lost belongings or valuables.   Notify your doctor if there is any change in your medical condition (cold, fever, infection).  Wear comfortable clothing (specific to your surgery type) to the hospital.  After surgery, you can help prevent lung complications by doing breathing exercises.  Take deep breaths and cough every 1-2 hours. Your doctor may order a device called an Incentive Spirometer to help you take deep breaths. When coughing or sneezing, hold a pillow firmly against your incision with both hands. This is called splinting. Doing this helps protect your incision. It also decreases belly discomfort.  If you are being admitted to the hospital overnight,  leave your suitcase in the car. After surgery it may be brought to your room.  If you are being discharged the day of surgery, you will not be allowed to drive home. You will need a responsible adult (18 years or older) to drive you home and stay with you that night.   If you are taking public transportation, you will need to have a responsible adult (18  years or older) with you. Please confirm with your physician that it is acceptable to use public transportation.   Please call the [employer] at [phone] if you have any questions about these instructions.  Surgery Visitation Policy:  Patients undergoing a surgery or procedure may have one family member or support person with them as long as that person is not COVID-19 positive or experiencing its symptoms.  That person may remain in the waiting area during the procedure and may rotate out with other people.  Inpatient Visitation:    Visiting hours are 7 a.m. to 8 p.m. Up to two visitors ages 16+ are allowed at one time in a patient room. The visitors may rotate out with other people during the day. Visitors must check out when they leave, or other visitors will not be allowed. One designated support person may remain overnight. The visitor must pass COVID-19 screenings, use hand sanitizer when entering and exiting the patients room and wear a mask at all times, including in the patients room. Patients must also wear a mask when staff or their visitor are in the room. Masking is required regardless of vaccination status.

## 2021-10-10 ENCOUNTER — Other Ambulatory Visit: Payer: Self-pay | Admitting: Internal Medicine

## 2021-10-12 ENCOUNTER — Encounter
Admission: RE | Admit: 2021-10-12 | Discharge: 2021-10-12 | Disposition: A | Discharge status: Home / Self Care | Payer: Medicare HMO | Source: Ambulatory Visit | Attending: General Surgery | Admitting: General Surgery

## 2021-10-12 ENCOUNTER — Other Ambulatory Visit: Payer: Self-pay

## 2021-10-12 DIAGNOSIS — Z01810 Encounter for preprocedural cardiovascular examination: Secondary | ICD-10-CM | POA: Diagnosis present

## 2021-10-15 ENCOUNTER — Encounter
Admission: RE | Disposition: A | Discharge status: Home / Self Care | Payer: Self-pay | Source: Ambulatory Visit | Attending: General Surgery

## 2021-10-15 ENCOUNTER — Ambulatory Visit: Payer: Medicare HMO | Admitting: Anesthesiology

## 2021-10-15 ENCOUNTER — Ambulatory Visit
Admission: RE | Admit: 2021-10-15 | Discharge: 2021-10-15 | Disposition: A | Discharge status: Home / Self Care | Payer: Medicare HMO | Source: Ambulatory Visit | Attending: General Surgery | Admitting: General Surgery

## 2021-10-15 ENCOUNTER — Encounter: Payer: Self-pay | Admitting: General Surgery

## 2021-10-15 ENCOUNTER — Other Ambulatory Visit: Payer: Self-pay

## 2021-10-15 DIAGNOSIS — Z8614 Personal history of Methicillin resistant Staphylococcus aureus infection: Secondary | ICD-10-CM | POA: Insufficient documentation

## 2021-10-15 DIAGNOSIS — E039 Hypothyroidism, unspecified: Secondary | ICD-10-CM | POA: Diagnosis not present

## 2021-10-15 DIAGNOSIS — Z85828 Personal history of other malignant neoplasm of skin: Secondary | ICD-10-CM | POA: Diagnosis not present

## 2021-10-15 DIAGNOSIS — E785 Hyperlipidemia, unspecified: Secondary | ICD-10-CM | POA: Diagnosis not present

## 2021-10-15 DIAGNOSIS — K219 Gastro-esophageal reflux disease without esophagitis: Secondary | ICD-10-CM | POA: Insufficient documentation

## 2021-10-15 DIAGNOSIS — K4090 Unilateral inguinal hernia, without obstruction or gangrene, not specified as recurrent: Secondary | ICD-10-CM | POA: Insufficient documentation

## 2021-10-15 DIAGNOSIS — F419 Anxiety disorder, unspecified: Secondary | ICD-10-CM | POA: Diagnosis not present

## 2021-10-15 HISTORY — PX: INGUINAL HERNIA REPAIR: SHX194

## 2021-10-15 HISTORY — PX: INSERTION OF MESH: SHX5868

## 2021-10-15 SURGERY — REPAIR, HERNIA, INGUINAL, ADULT
Anesthesia: General | Laterality: Left

## 2021-10-15 MED ORDER — FENTANYL CITRATE (PF) 100 MCG/2ML IJ SOLN
INTRAMUSCULAR | Status: DC | PRN
Start: 2021-10-15 — End: 2021-10-15
  Administered 2021-10-15 (×2): 25 ug via INTRAVENOUS

## 2021-10-15 MED ORDER — ORAL CARE MOUTH RINSE: 15.0000 mL | Freq: Once | OROMUCOSAL | Status: AC

## 2021-10-15 MED ORDER — BUPIVACAINE-EPINEPHRINE (PF) 0.5% -1:200000 IJ SOLN
INTRAMUSCULAR | Status: AC
  Filled 2021-10-15: qty 30

## 2021-10-15 MED ORDER — CHLORHEXIDINE GLUCONATE 0.12 % MT SOLN: 15.0000 mL | Freq: Once | OROMUCOSAL | Status: AC

## 2021-10-15 MED ORDER — CHLORHEXIDINE GLUCONATE CLOTH 2 % EX PADS
6.0000 | MEDICATED_PAD | Freq: Once | CUTANEOUS | Status: AC
  Administered 2021-10-15: 6 via TOPICAL

## 2021-10-15 MED ORDER — FENTANYL CITRATE (PF) 100 MCG/2ML IJ SOLN: 25.0000 ug | INTRAMUSCULAR | Status: DC | PRN

## 2021-10-15 MED ORDER — ACETAMINOPHEN 10 MG/ML IV SOLN
INTRAVENOUS | Status: DC | PRN
Start: 2021-10-15 — End: 2021-10-15
  Administered 2021-10-15: 1000 mg via INTRAVENOUS

## 2021-10-15 MED ORDER — ONDANSETRON HCL 4 MG/2ML IJ SOLN
INTRAMUSCULAR | Status: DC | PRN
Start: 2021-10-15 — End: 2021-10-15
  Administered 2021-10-15: 4 mg via INTRAVENOUS

## 2021-10-15 MED ORDER — OXYCODONE HCL 5 MG PO TABS: 5.0000 mg | ORAL_TABLET | Freq: Once | ORAL | Status: DC | PRN

## 2021-10-15 MED ORDER — DEXAMETHASONE SODIUM PHOSPHATE 10 MG/ML IJ SOLN
INTRAMUSCULAR | Status: DC | PRN
Start: 2021-10-15 — End: 2021-10-15
  Administered 2021-10-15: 10 mg via INTRAVENOUS

## 2021-10-15 MED ORDER — BUPIVACAINE-EPINEPHRINE (PF) 0.5% -1:200000 IJ SOLN
INTRAMUSCULAR | Status: DC | PRN
  Administered 2021-10-15: 10 mL via PERINEURAL
  Administered 2021-10-15: 20 mL via PERINEURAL

## 2021-10-15 MED ORDER — ACETAMINOPHEN 10 MG/ML IV SOLN
INTRAVENOUS | Status: AC
  Filled 2021-10-15: qty 100

## 2021-10-15 MED ORDER — LIDOCAINE HCL (CARDIAC) PF 100 MG/5ML IV SOSY
PREFILLED_SYRINGE | INTRAVENOUS | Status: DC | PRN
  Administered 2021-10-15: 60 mg via INTRAVENOUS

## 2021-10-15 MED ORDER — LACTATED RINGERS IV SOLN: INTRAVENOUS | Status: DC | PRN

## 2021-10-15 MED ORDER — FAMOTIDINE 20 MG PO TABS
ORAL_TABLET | ORAL | Status: AC
  Administered 2021-10-15: 20 mg via ORAL
  Filled 2021-10-15: qty 1

## 2021-10-15 MED ORDER — 0.9 % SODIUM CHLORIDE (POUR BTL) OPTIME
TOPICAL | Status: DC | PRN
  Administered 2021-10-15: 250 mL

## 2021-10-15 MED ORDER — DEXAMETHASONE SODIUM PHOSPHATE 10 MG/ML IJ SOLN
INTRAMUSCULAR | Status: AC
  Filled 2021-10-15: qty 1

## 2021-10-15 MED ORDER — ONDANSETRON HCL 4 MG/2ML IJ SOLN
INTRAMUSCULAR | Status: AC
  Filled 2021-10-15: qty 2

## 2021-10-15 MED ORDER — CEFAZOLIN SODIUM-DEXTROSE 2-4 GM/100ML-% IV SOLN
INTRAVENOUS | Status: AC
  Filled 2021-10-15: qty 100

## 2021-10-15 MED ORDER — CHLORHEXIDINE GLUCONATE 0.12 % MT SOLN
OROMUCOSAL | Status: AC
  Administered 2021-10-15: 15 mL via OROMUCOSAL
  Filled 2021-10-15: qty 15

## 2021-10-15 MED ORDER — CEFAZOLIN SODIUM-DEXTROSE 2-4 GM/100ML-% IV SOLN
2.0000 g | INTRAVENOUS | Status: AC
  Administered 2021-10-15: 2 g via INTRAVENOUS

## 2021-10-15 MED ORDER — FAMOTIDINE 20 MG PO TABS: 20.0000 mg | ORAL_TABLET | Freq: Once | ORAL | Status: AC

## 2021-10-15 MED ORDER — CHLORHEXIDINE GLUCONATE CLOTH 2 % EX PADS: 6.0000 | MEDICATED_PAD | Freq: Once | CUTANEOUS | Status: DC

## 2021-10-15 MED ORDER — EPHEDRINE SULFATE 50 MG/ML IJ SOLN
INTRAMUSCULAR | Status: DC | PRN
  Administered 2021-10-15: 10 mg via INTRAVENOUS
  Administered 2021-10-15: 5 mg via INTRAVENOUS
  Administered 2021-10-15: 10 mg via INTRAVENOUS

## 2021-10-15 MED ORDER — PROPOFOL 10 MG/ML IV BOLUS
INTRAVENOUS | Status: DC | PRN
  Administered 2021-10-15: 50 mg via INTRAVENOUS
  Administered 2021-10-15: 30 mg via INTRAVENOUS
  Administered 2021-10-15: 120 mg via INTRAVENOUS

## 2021-10-15 MED ORDER — HYDROCODONE-ACETAMINOPHEN 5-325 MG PO TABS: ORAL_TABLET | ORAL | 0 refills | Status: DC

## 2021-10-15 MED ORDER — FENTANYL CITRATE (PF) 100 MCG/2ML IJ SOLN
INTRAMUSCULAR | Status: AC
  Filled 2021-10-15: qty 2

## 2021-10-15 MED ORDER — KETOROLAC TROMETHAMINE 30 MG/ML IJ SOLN
INTRAMUSCULAR | Status: DC | PRN
  Administered 2021-10-15: 30 mg

## 2021-10-15 MED ORDER — LACTATED RINGERS IV SOLN: INTRAVENOUS | Status: DC

## 2021-10-15 MED ORDER — KETOROLAC TROMETHAMINE 30 MG/ML IJ SOLN
INTRAMUSCULAR | Status: AC
  Filled 2021-10-15: qty 1

## 2021-10-15 MED ORDER — OXYCODONE HCL 5 MG/5ML PO SOLN: 5.0000 mg | Freq: Once | ORAL | Status: DC | PRN

## 2021-10-15 SURGICAL SUPPLY — 34 items
APL PRP STRL LF DISP 70% ISPRP (MISCELLANEOUS) ×2
BLADE SURG 15 STRL SS SAFETY (BLADE) ×6 IMPLANT
CHLORAPREP W/TINT 26 (MISCELLANEOUS) ×3 IMPLANT
DECANTER SPIKE VIAL GLASS SM (MISCELLANEOUS) ×3 IMPLANT
DRAIN PENROSE 12X.25 LTX STRL (MISCELLANEOUS) ×3 IMPLANT
DRAPE LAPAROTOMY 100X77 ABD (DRAPES) ×3 IMPLANT
DRSG TEGADERM 4X4.75 (GAUZE/BANDAGES/DRESSINGS) ×3 IMPLANT
DRSG TELFA 4X3 1S NADH ST (GAUZE/BANDAGES/DRESSINGS) ×3 IMPLANT
ELECT REM PT RETURN 9FT ADLT (ELECTROSURGICAL) ×3
ELECTRODE REM PT RTRN 9FT ADLT (ELECTROSURGICAL) ×2 IMPLANT
GAUZE 4X4 16PLY ~~LOC~~+RFID DBL (SPONGE) ×3 IMPLANT
GLOVE SURG ENC MOIS LTX SZ7.5 (GLOVE) ×3 IMPLANT
GLOVE SURG UNDER LTX SZ8 (GLOVE) ×3 IMPLANT
GOWN STRL REUS W/ TWL LRG LVL3 (GOWN DISPOSABLE) ×4 IMPLANT
GOWN STRL REUS W/TWL LRG LVL3 (GOWN DISPOSABLE) ×6
KIT TURNOVER KIT A (KITS) ×3 IMPLANT
LABEL OR SOLS (LABEL) ×3 IMPLANT
MANIFOLD NEPTUNE II (INSTRUMENTS) ×3 IMPLANT
MESH MARLEX PLUG MEDIUM (Mesh General) ×1 IMPLANT
NEEDLE HYPO 22GX1.5 SAFETY (NEEDLE) ×6 IMPLANT
PACK BASIN MINOR ARMC (MISCELLANEOUS) ×3 IMPLANT
STRIP CLOSURE SKIN 1/2X4 (GAUZE/BANDAGES/DRESSINGS) ×3 IMPLANT
SUT SURGILON 0 BLK (SUTURE) ×3 IMPLANT
SUT VIC AB 2-0 SH 27 (SUTURE) ×3
SUT VIC AB 2-0 SH 27XBRD (SUTURE) ×2 IMPLANT
SUT VIC AB 3-0 54X BRD REEL (SUTURE) ×2 IMPLANT
SUT VIC AB 3-0 BRD 54 (SUTURE) ×3
SUT VIC AB 3-0 SH 27 (SUTURE) ×3
SUT VIC AB 3-0 SH 27X BRD (SUTURE) ×2 IMPLANT
SUT VIC AB 4-0 FS2 27 (SUTURE) ×3 IMPLANT
SWABSTK COMLB BENZOIN TINCTURE (MISCELLANEOUS) ×3 IMPLANT
SYR 10ML LL (SYRINGE) ×3 IMPLANT
SYR 3ML LL SCALE MARK (SYRINGE) ×3 IMPLANT
WATER STERILE IRR 500ML POUR (IV SOLUTION) ×3 IMPLANT

## 2021-10-15 NOTE — Discharge Instructions (Signed)

## 2021-10-15 NOTE — Anesthesia Preprocedure Evaluation (Signed)
Anesthesia Evaluation  Patient identified by MRN, date of birth, ID band Patient awake    Reviewed: Allergy & Precautions, NPO status , Patient's Chart, lab work & pertinent test results  Airway Mallampati: III  TM Distance: <3 FB Neck ROM: full    Dental  (+) Chipped   Pulmonary neg pulmonary ROS, neg shortness of breath,    Pulmonary exam normal        Cardiovascular Exercise Tolerance: Good (-) angina(-) Past MI and (-) DOE negative cardio ROS Normal cardiovascular exam     Neuro/Psych PSYCHIATRIC DISORDERS Anxiety negative neurological ROS     GI/Hepatic Neg liver ROS, GERD  Medicated and Controlled,  Endo/Other  Hypothyroidism   Renal/GU      Musculoskeletal   Abdominal   Peds  Hematology negative hematology ROS (+)   Anesthesia Other Findings Past Medical History: No date: Anxiety No date: 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 No date: GERD (gastroesophageal reflux disease) 2012: History of methicillin resistant staphylococcus aureus (MRSA) No date: Hyperlipidemia No date: Hypothyroidism  Past Surgical History: 1959: APPENDECTOMY No date: COLONOSCOPY 2013: HERNIA REPAIR     Comment:  Right side with mesh 2011: 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 1959: TONSILLECTOMY 1982: TUBAL LIGATION 1991: VARICOSE VEIN SURGERY  BMI    Body Mass Index: 26.93 kg/m      Reproductive/Obstetrics negative OB ROS                             Anesthesia Physical Anesthesia Plan  ASA: 2  Anesthesia Plan:    Post-op Pain Management:    Induction: Intravenous  PONV Risk Score and Plan:   Airway Management Planned: LMA  Additional Equipment:   Intra-op Plan:   Post-operative Plan: Extubation in OR  Informed Consent: I have reviewed the patients History and Physical, chart, labs and discussed the procedure including the risks, benefits and alternatives for  the proposed anesthesia with the patient or authorized representative who has indicated his/her understanding and acceptance.     Dental Advisory Given  Plan Discussed with: Anesthesiologist, CRNA and Surgeon  Anesthesia Plan Comments: (Patient consented for risks of anesthesia including but not limited to:  - adverse reactions to medications - damage to eyes, teeth, lips or other oral mucosa - nerve damage due to positioning  - sore throat or hoarseness - Damage to heart, brain, nerves, lungs, other parts of body or loss of life  Patient voiced understanding.)        Anesthesia Quick Evaluation

## 2021-10-15 NOTE — H&P (Signed)
Mayer Masker, MD - 09/23/2021 9:15 AM EST Formatting of this note is different from the original. Images from the original note were not included. Subjective:   Patient ID: Michelle Butler is a 81 y.o. female.  HPI  The following portions of the patient's history were reviewed and updated as appropriate.  This an established patient is here today for: office visit. Here for re evaluation of a left inguinal hernia. She denies pain to the area but states there is a knot that is there now. The patient was seen in October 2022 at that time she was somewhat ambivalent about inguinal hernia surgery. She has been more symptomatic since that time, with the more palpable appearance of a knot at the hernia site, but she remains pain-free. She is here with her husband, Michelle Butler.  Review of Systems  Constitutional: Negative for chills and fever.  Respiratory: Negative for cough.   Chief Complaint  Patient presents with   Hernia    BP 124/66   Pulse 74   Temp 36.8 C (98.2 F)   Ht 161.3 cm (5' 3.5")   Wt 70.8 kg (156 lb)   SpO2 98%   BMI 27.20 kg/m   Past Medical History:  Diagnosis Date   Hypothyroidism   Rosacea    Past Surgical History:  Procedure Laterality Date   APPENDECTOMY 1959   COLONOSCOPY  2015?   INGUINAL HERNIA REPAIR Right 2012  Dr Jeneen Rinks Egbert   LAPAROSCOPIC TUBAL LIGATION 1981   REPLACEMENT TOTAL HIP W/ RESURFACING IMPLANTS Left 2011   REPLACEMENT TOTAL HIP W/ RESURFACING IMPLANTS Right 2000   STAB PHLEBECTOMY VARICOSE VEINS ONE EXTREMITY 1991   TONSILLECTOMY 1959    OB History  Gravida  4  Para  4  Term   Preterm   AB   Living    SAB   IAB   Ectopic   Molar   Multiple   Live Births     Obstetric Comments  Age at first period 33 Age of first pregnancy 89     Social History   Socioeconomic History   Marital status: Married  Tobacco Use   Smoking status: Never   Smokeless tobacco: Never  Substance and Sexual  Activity   Alcohol use: Yes  Comment: wine occasionally   Drug use: Never    Allergies  Allergen Reactions   Citalopram Nausea   Duloxetine Nausea   Current Outpatient Medications  Medication Sig Dispense Refill   acetaminophen (TYLENOL) 500 MG tablet Take 500 mg by mouth every 6 (six) hours as needed for Pain   atorvastatin (LIPITOR) 20 MG tablet as directed Twice a week   calcium carbonate (CALCIUM 600 ORAL) Take by mouth once daily   carboxymethylce-glycern-poly80 0.5-1-0.5 % Drop Apply to eye 2 (two) times daily   cholecalciferol (VITAMIN D3) 1000 unit tablet Take by mouth 2 (two) times daily   cyanocobalamin (VITAMIN B12) 1000 MCG tablet Take by mouth once daily   denosumab (PROLIA) 60 mg/mL inj syringe Inject subcutaneously 2x a year   levothyroxine (SYNTHROID) 100 MCG tablet Take by mouth once daily   LORazepam (ATIVAN) 1 MG tablet as needed   metroNIDAZOLE (METROCREAM) 0.75 % cream as needed   naproxen sodium (ALEVE) 220 MG tablet Take 220 mg by mouth 2 (two) times daily as needed for Pain   sertraline (ZOLOFT) 50 MG tablet Take 50 mg by mouth once daily   No current facility-administered medications for this visit.   Family History  Problem Relation Age of Onset   Peripheral Vascular Disease (PVD or blocked arteries in arms and legs) Mother   Melanoma Brother   Stroke Maternal Grandfather   Stroke Paternal Grandmother   Colon cancer Neg Hx   Breast cancer Neg Hx   Labs and Radiology:   June 30, 2021 laboratory review:  WBC 4.0 - 10.5 K/uL 4.0  RBC 3.87 - 5.11 Mil/uL 4.15  Hemoglobin 12.0 - 15.0 g/dL 12.9  HCT 36.0 - 46.0 % 38.8  MCV 78.0 - 100.0 fl 93.4  MCHC 30.0 - 36.0 g/dL 33.4  RDW 11.5 - 15.5 % 13.8  Platelets 150.0 - 400.0 K/uL 136.0 Low   Neutrophils Relative % 43.0 - 77.0 % 47.0  Lymphocytes Relative 12.0 - 46.0 % 32.2  Monocytes Relative 3.0 - 12.0 % 18.1 Repeated and verified X2. High   Eosinophils Relative 0.0 - 5.0 % 2.0  Basophils  Relative 0.0 - 3.0 % 0.7  Neutro Abs 1.4 - 7.7 K/uL 1.9  Lymphs Abs 0.7 - 4.0 K/uL 1.3  Monocytes Absolute 0.1 - 1.0 K/uL 0.7  Eosinophils Absolute 0.0 - 0.7 K/uL 0.1  Basophils Absolute 0.0 - 0.1 K/uL 0.0    Component Ref Range & Units 1 mo ago  Sodium 135 - 145 mEq/L 138  Potassium 3.5 - 5.1 mEq/L 4.1  Chloride 96 - 112 mEq/L 106  CO2 19 - 32 mEq/L 26  Glucose, Bld 70 - 99 mg/dL 84  BUN 6 - 23 mg/dL 15  Creatinine, Ser 0.40 - 1.20 mg/dL 0.85  Total Bilirubin 0.2 - 1.2 mg/dL 0.7  Alkaline Phosphatase 39 - 117 U/L 30 Low   AST 0 - 37 U/L 17  ALT 0 - 35 U/L 13  Total Protein 6.0 - 8.3 g/dL 6.5  Albumin 3.5 - 5.2 g/dL 4.0  GFR >60.00 mL/min 64.58  Comment: Calculated using the CKD-EPI Creatinine Equation (2021)    Objective:  Physical Exam Exam conducted with a chaperone present.  Constitutional:  Appearance: Normal appearance.  Cardiovascular:  Rate and Rhythm: Normal rate and regular rhythm.  Pulses: Normal pulses.  Heart sounds: Normal heart sounds.  Pulmonary:  Effort: Pulmonary effort is normal.  Breath sounds: Normal breath sounds.  Abdominal:  Hernia: A hernia is present. Hernia is present in the left inguinal area.   Comments: Reducible left inguinal hernia.  Musculoskeletal:  Cervical back: Neck supple.  Skin: General: Skin is warm and dry.  Neurological:  Mental Status: She is alert and oriented to person, place, and time.  Psychiatric:  Mood and Affect: Mood normal.  Behavior: Behavior normal.    Assessment:   Enlarging left inguinal hernia.  Plan:   Indications for repair were reviewed. Role of prosthetic mesh discussed. Risk of infection with or without mesh repair were reviewed. Postoperative restrictions and analgesic measures reviewed.  Surgery tentatively scheduled for October 15, 2021.   This note is partially prepared by Karie Fetch, RN, acting as a scribe in the presence of Dr. Hervey Ard, MD.  The documentation recorded by the  scribe accurately reflects the service I personally performed and the decisions made by me.   Robert Bellow, MD FACS  Electronically signed by Mayer Masker, MD at 09/25/2021 2:50 PM EST    No change in clinical history or exam.    For left inguinal hernia repair.

## 2021-10-15 NOTE — Anesthesia Postprocedure Evaluation (Signed)
Anesthesia Post Note  Patient: Michelle Butler  Procedure(s) Performed: HERNIA REPAIR INGUINAL ADULT (Left) INSERTION OF MESH  Patient location during evaluation: PACU Anesthesia Type: General Level of consciousness: awake and alert Pain management: pain level controlled Vital Signs Assessment: post-procedure vital signs reviewed and stable Respiratory status: spontaneous breathing, nonlabored ventilation, respiratory function stable and patient connected to nasal cannula oxygen Cardiovascular status: blood pressure returned to baseline and stable Postop Assessment: no apparent nausea or vomiting Anesthetic complications: no   No notable events documented.   Last Vitals:  Vitals:   10/15/21 1400 10/15/21 1419  BP: 118/64 (!) 120/56  Pulse: 84 81  Resp: 20 18  Temp: (!) 36.1 C 37 C  SpO2: 100% 96%    Last Pain:  Vitals:   10/15/21 1419  TempSrc: Temporal  PainSc: 0-No pain                 Precious Haws Salar Molden

## 2021-10-15 NOTE — Op Note (Signed)
Preoperative diagnosis: Symptomatic left inguinal hernia.  Postoperative diagnosis: Same.  Operative procedure: Left indirect inguinal hernia repair with excision of lipoma, placement of Bard PerFix plug and patch.  Operating Surgeon: Hervey Ard, MD.  Anesthesia: General by LMA, Marcaine 0.5% with 1: 200,000 units of epinephrine, 30 cc; Toradol: 30 mg.  Estimated blood loss: 1 cc.  Clinical note: This 81 year old woman is developed symptomatic left inguinal hernia.  She is admitted for elective repair.  She had Ancef intravenously prior to anesthesia.  SCD stockings for DVT prevention.  Hair was removed and the surgical site with clippers.  Operative note: With the patient under adequate general anesthesia the area was cleansed with ChloraPrep and draped.  Field block anesthesia was established.  A 5 cm skin line incision along the anticipated course of the inguinal canal was made.  Skin was incised sharply and remaining dissection completed with electrocautery.  The external oblique fascia was opened in the direction of its fibers.  The iliohypogastric nerve was identified and protected as was the genitofemoral nerve.  The ilioinguinal nerve was not identified.  The round ligament and hernia sac was identified.  The former was transected with hemostasis by 3-0 Vicryl tie.  A lipoma tracking along the lateral aspect of the internal ring was controlled with a 3-0 Vicryl tie excised and discarded.  The sac was returned to the preperitoneal space.  A medium Bard PerFix plug was then placed and anchored to the transverse abdominis aponeurosis and iliopubic tract with interrupted 0 Surgilon sutures x2.  An onlay mesh was then placed anchored to the pubic tubercle and along the inguinal ligament with interrupted 0 Surgilon.  The medial and superior borders were anchored to the transverse abdominis aponeurosis.  Toradol was placed into the wound.  The external oblique was closed with a running 2-0 Vicryl  suture.  Scarpa's fascia was closed with a running 3-0 Vicryl suture.  The skin closed with a running 4-0 Vicryl suture.  The patient tolerated the procedure well and was taken to the PACU in stable condition.

## 2021-10-15 NOTE — Anesthesia Procedure Notes (Signed)
Procedure Name: LMA Insertion Date/Time: 10/15/2021 12:22 PM Performed by: Jerrye Noble, CRNA Pre-anesthesia Checklist: Patient identified, Emergency Drugs available, Suction available and Patient being monitored Patient Re-evaluated:Patient Re-evaluated prior to induction Oxygen Delivery Method: Circle system utilized Preoxygenation: Pre-oxygenation with 100% oxygen Induction Type: IV induction Ventilation: Mask ventilation without difficulty LMA: LMA inserted LMA Size: 3.5 Number of attempts: 1 Placement Confirmation: positive ETCO2 and breath sounds checked- equal and bilateral Tube secured with: Tape Dental Injury: Teeth and Oropharynx as per pre-operative assessment

## 2021-10-15 NOTE — Transfer of Care (Signed)
Immediate Anesthesia Transfer of Care Note  Patient: Michelle Butler  Procedure(s) Performed: HERNIA REPAIR INGUINAL ADULT (Left) INSERTION OF MESH  Patient Location: PACU  Anesthesia Type:General  Level of Consciousness: drowsy and patient cooperative  Airway & Oxygen Therapy: Patient Spontanous Breathing and Patient connected to face mask oxygen  Post-op Assessment: Report given to RN and Post -op Vital signs reviewed and stable  Post vital signs: Reviewed and stable  Last Vitals:  Vitals Value Taken Time  BP    Temp    Pulse 91 10/15/21 1322  Resp 18 10/15/21 1322  SpO2 100 % 10/15/21 1322  Vitals shown include unvalidated device data.  Last Pain:  Vitals:   10/15/21 1052  TempSrc: Temporal  PainSc: 0-No pain         Complications: No notable events documented.

## 2021-10-18 ENCOUNTER — Encounter: Payer: Self-pay | Admitting: General Surgery

## 2021-12-13 ENCOUNTER — Telehealth: Payer: Self-pay | Admitting: Internal Medicine

## 2021-12-13 DIAGNOSIS — E785 Hyperlipidemia, unspecified: Secondary | ICD-10-CM

## 2021-12-13 DIAGNOSIS — R7301 Impaired fasting glucose: Secondary | ICD-10-CM

## 2021-12-13 DIAGNOSIS — E039 Hypothyroidism, unspecified: Secondary | ICD-10-CM

## 2021-12-13 DIAGNOSIS — Z79899 Other long term (current) drug therapy: Secondary | ICD-10-CM

## 2021-12-13 NOTE — Telephone Encounter (Signed)
Patient has a up coming appointment and is requesting to have labs before her appointment. No orders  ?

## 2021-12-13 NOTE — Telephone Encounter (Signed)
Ordered A1c, CMP, CBC, TSH, and lipid panel to have done before her next appt on 12/31/2021. Is there anything else that needs to be ordered?  ?

## 2021-12-17 ENCOUNTER — Ambulatory Visit: Payer: Medicare HMO

## 2021-12-28 ENCOUNTER — Other Ambulatory Visit: Payer: Self-pay

## 2021-12-28 ENCOUNTER — Other Ambulatory Visit (INDEPENDENT_AMBULATORY_CARE_PROVIDER_SITE_OTHER): Payer: Medicare HMO

## 2021-12-28 DIAGNOSIS — Z79899 Other long term (current) drug therapy: Secondary | ICD-10-CM | POA: Diagnosis not present

## 2021-12-28 DIAGNOSIS — E785 Hyperlipidemia, unspecified: Secondary | ICD-10-CM

## 2021-12-28 DIAGNOSIS — R7301 Impaired fasting glucose: Secondary | ICD-10-CM | POA: Diagnosis not present

## 2021-12-28 DIAGNOSIS — E039 Hypothyroidism, unspecified: Secondary | ICD-10-CM | POA: Diagnosis not present

## 2021-12-28 LAB — CBC WITH DIFFERENTIAL/PLATELET
Basophils Absolute: 0 10*3/uL (ref 0.0–0.1)
Basophils Relative: 0.6 % (ref 0.0–3.0)
Eosinophils Absolute: 0 10*3/uL (ref 0.0–0.7)
Eosinophils Relative: 0.8 % (ref 0.0–5.0)
HCT: 40.3 % (ref 36.0–46.0)
Hemoglobin: 13.4 g/dL (ref 12.0–15.0)
Lymphocytes Relative: 28.6 % (ref 12.0–46.0)
Lymphs Abs: 1.7 10*3/uL (ref 0.7–4.0)
MCHC: 33.3 g/dL (ref 30.0–36.0)
MCV: 94.4 fl (ref 78.0–100.0)
Monocytes Absolute: 0.8 10*3/uL (ref 0.1–1.0)
Monocytes Relative: 13.3 % — ABNORMAL HIGH (ref 3.0–12.0)
Neutro Abs: 3.3 10*3/uL (ref 1.4–7.7)
Neutrophils Relative %: 56.7 % (ref 43.0–77.0)
Platelets: 138 10*3/uL — ABNORMAL LOW (ref 150.0–400.0)
RBC: 4.27 Mil/uL (ref 3.87–5.11)
RDW: 14.5 % (ref 11.5–15.5)
WBC: 5.8 10*3/uL (ref 4.0–10.5)

## 2021-12-28 LAB — COMPREHENSIVE METABOLIC PANEL
ALT: 20 U/L (ref 0–35)
AST: 23 U/L (ref 0–37)
Albumin: 4.1 g/dL (ref 3.5–5.2)
Alkaline Phosphatase: 37 U/L — ABNORMAL LOW (ref 39–117)
BUN: 13 mg/dL (ref 6–23)
CO2: 28 mEq/L (ref 19–32)
Calcium: 9 mg/dL (ref 8.4–10.5)
Chloride: 103 mEq/L (ref 96–112)
Creatinine, Ser: 0.83 mg/dL (ref 0.40–1.20)
GFR: 66.22 mL/min (ref >60.00)
Glucose, Bld: 80 mg/dL (ref 70–99)
Potassium: 4.6 mEq/L (ref 3.5–5.1)
Sodium: 137 mEq/L (ref 135–145)
Total Bilirubin: 0.5 mg/dL (ref 0.2–1.2)
Total Protein: 6.8 g/dL (ref 6.0–8.3)

## 2021-12-28 LAB — LIPID PANEL
Cholesterol: 177 mg/dL (ref 0–200)
HDL: 69 mg/dL (ref >39.00)
LDL Cholesterol: 88 mg/dL (ref 0–99)
NonHDL: 108.29
Total CHOL/HDL Ratio: 3
Triglycerides: 103 mg/dL (ref 0.0–149.0)
VLDL: 20.6 mg/dL (ref 0.0–40.0)

## 2021-12-28 LAB — TSH: TSH: 1.53 u[IU]/mL (ref 0.35–5.50)

## 2021-12-28 LAB — HEMOGLOBIN A1C: Hgb A1c MFr Bld: 5.6 % (ref 4.6–6.5)

## 2021-12-31 ENCOUNTER — Ambulatory Visit (INDEPENDENT_AMBULATORY_CARE_PROVIDER_SITE_OTHER): Payer: Medicare HMO | Admitting: Internal Medicine

## 2021-12-31 ENCOUNTER — Other Ambulatory Visit: Payer: Self-pay

## 2021-12-31 VITALS — BP 128/70 | HR 70 | Temp 98.0°F | Resp 16 | Ht 63.5 in | Wt 158.6 lb

## 2021-12-31 DIAGNOSIS — R7301 Impaired fasting glucose: Secondary | ICD-10-CM | POA: Diagnosis not present

## 2021-12-31 DIAGNOSIS — E039 Hypothyroidism, unspecified: Secondary | ICD-10-CM | POA: Diagnosis not present

## 2021-12-31 DIAGNOSIS — F411 Generalized anxiety disorder: Secondary | ICD-10-CM

## 2021-12-31 DIAGNOSIS — E785 Hyperlipidemia, unspecified: Secondary | ICD-10-CM | POA: Diagnosis not present

## 2021-12-31 DIAGNOSIS — I708 Atherosclerosis of other arteries: Secondary | ICD-10-CM

## 2021-12-31 DIAGNOSIS — Z1231 Encounter for screening mammogram for malignant neoplasm of breast: Secondary | ICD-10-CM

## 2021-12-31 DIAGNOSIS — I700 Atherosclerosis of aorta: Secondary | ICD-10-CM

## 2021-12-31 DIAGNOSIS — M816 Localized osteoporosis [Lequesne]: Secondary | ICD-10-CM

## 2021-12-31 DIAGNOSIS — K4090 Unilateral inguinal hernia, without obstruction or gangrene, not specified as recurrent: Secondary | ICD-10-CM

## 2021-12-31 DIAGNOSIS — D696 Thrombocytopenia, unspecified: Secondary | ICD-10-CM

## 2021-12-31 DIAGNOSIS — F410 Panic disorder [episodic paroxysmal anxiety] without agoraphobia: Secondary | ICD-10-CM

## 2021-12-31 MED ORDER — SERTRALINE HCL 50 MG PO TABS: 75.0000 mg | ORAL_TABLET | Freq: Every day | ORAL | 1 refills | Status: DC

## 2021-12-31 NOTE — Patient Instructions (Addendum)
Ok to increase the sertraline to 75 mg daily, and then again (if needed) to 100 mg daily after several weeks if you do not feel an improvement in your overall anxiety level ? ? ?Labs look fine.  Monocytosis is MINIMAL (and relative, not absolute) so no workup is warranted ? ?Repeat labs in 6 months,  prior to next visit  ?

## 2021-12-31 NOTE — Progress Notes (Signed)
? ?Subjective:  ?Patient ID: Michelle Butler, female    DOB: [birth year]  Age: 81 y.o. MRN: [id number] ? ?CC: The primary encounter diagnosis was Encounter for screening mammogram for malignant neoplasm of breast. Diagnoses of Hypothyroidism (acquired), Hyperlipidemia with target LDL less than 130, Impaired fasting glucose, Thrombocytopenia (HCC), Generalized anxiety disorder with panic attacks, Atherosclerosis of aortic bifurcation and common iliac arteries ([place]), Localized osteoporosis without current pathological fracture, and Left inguinal hernia were also pertinent to this visit. ? ? ?This visit occurred during the SARS-CoV-2 public health emergency.  Safety protocols were in place, including screening questions prior to the visit, additional usage of staff PPE, and extensive cleaning of exam room while observing appropriate contact time as indicated for disinfecting solutions.   ? ?HPI ?Michelle Butler presents for follow up on GAD managed with sertraline and prn lorazepam  ?Chief Complaint  ?Patient presents with  ? Follow-up  ?  6 month follow up   ? ? ?Sertraline  is helping but more managed her anxiety which is aggravated by more family issues.  Usimg 1/2 lorazepam most nights.  Discussed increasing to 75 mg daily.  ? ?Overweight :  struggling to maintain weight.   Sugar a problem.   ? ?Aortic atherosclerosis:  Taking atorvastatin 2/weekly for aortic atherosclerosis.  LDL down  40 pts ? ?Thrombocytosis:  stable  chronic.  No bleeding issues  ? ?Monocytosis ? ?Calcium intake is through diet  ? ? ?Lab Results  ?Component Value Date  ? HGBA1C 5.6 12/28/2021  ? ? ? ?Outpatient Medications Prior to Visit  ?Medication Sig Dispense Refill  ? atorvastatin (LIPITOR) 20 MG tablet One tablet by mouth two times weekly (Patient taking differently: Take 20 mg by mouth 2 (two) times a week. Tuesday and Sat in PM) 24 tablet 3  ? calcium carbonate (TUMS - DOSED IN MG ELEMENTAL CALCIUM) 500 MG chewable tablet Chew 1 tablet  by mouth as needed for indigestion or heartburn.    ? Carboxymeth-Glyc-Polysorb PF (REFRESH OPTIVE MEGA-3) 0.5-1-0.5 % SOLN Place 1 drop into both eyes in the morning.    ? Carboxymethylcellulose Sodium (REFRESH LIQUIGEL OP) Place 1 drop into both eyes at bedtime.    ? cholecalciferol (VITAMIN D3) 25 MCG (1000 UNIT) tablet Take 1,000 Units by mouth in the morning and at bedtime.    ? denosumab (PROLIA) 60 MG/ML SOSY injection Inject 60 mg into the skin every 6 (six) months.    ? levothyroxine (SYNTHROID) 100 MCG tablet TAKE 1 TABLET BY MOUTH EVERY DAY 90 tablet 1  ? LORazepam (ATIVAN) 1 MG tablet TAKE 1 TABLET BY MOUTH AT BEDTIME AS NEEDED FOR ANXIETY 30 tablet 2  ? metroNIDAZOLE (METROCREAM) 0.75 % cream Apply 1 application topically daily as needed (rosacea).    ? naproxen sodium (ALEVE) 220 MG tablet Take 220 mg by mouth daily as needed (pain).    ? vitamin B-12 (CYANOCOBALAMIN) 1000 MCG tablet Take 1,000 mcg by mouth daily.    ? sertraline (ZOLOFT) 50 MG tablet Take 1 tablet (50 mg total) by mouth daily. (Patient taking differently: Take 50 mg by mouth every morning.) 90 tablet 1  ? acetaminophen (TYLENOL) 500 MG tablet Take 500 mg by mouth every 6 (six) hours as needed for moderate pain.    ? Psyllium (METAMUCIL) 48.57 % POWD Take 1 Scoop by mouth daily.    ? HYDROcodone-acetaminophen (NORCO/VICODIN) 5-325 MG tablet 1/2-1 tablet every 4-6 hours as needed for pain. 10 tablet 0  ? ?Facility-Administered Medications Prior  to Visit  ?Medication Dose Route Frequency Provider Last Rate Last Admin  ? denosumab (PROLIA) injection 60 mg  60 mg Subcutaneous Q6 months Crecencio Mc, MD   60 mg at 09/06/21 1544  ? ? ?Review of Systems; ? ?Patient denies headache, fevers, malaise, unintentional weight loss, skin rash, eye pain, sinus congestion and sinus pain, sore throat, dysphagia,  hemoptysis , cough, dyspnea, wheezing, chest pain, palpitations, orthopnea, edema, abdominal pain, nausea, melena, diarrhea, constipation,  flank pain, dysuria, hematuria, urinary  Frequency, nocturia, numbness, tingling, seizures,  Focal weakness, Loss of consciousness,  Tremor, insomnia, depression, anxiety, and suicidal ideation.   ? ? ? ?Objective:  ?BP 128/70   Pulse 70   Temp 98 ?F (36.7 ?C)   Resp 16   Ht 5' 3.5" (1.613 m)   Wt 158 lb 9.6 oz (71.9 kg)   SpO2 98%   BMI 27.65 kg/m?  ? ?BP Readings from Last 3 Encounters:  ?12/31/21 128/70  ?10/15/21 (!) 120/56  ?07/02/21 140/74  ? ? ?Wt Readings from Last 3 Encounters:  ?12/31/21 158 lb 9.6 oz (71.9 kg)  ?10/15/21 152 lb (68.9 kg)  ?07/02/21 161 lb 9.6 oz (73.3 kg)  ? ? ?General appearance: alert, cooperative and appears stated age ?Ears: normal TM's and external ear canals both ears ?Throat: lips, mucosa, and tongue normal; teeth and gums normal ?Neck: no adenopathy, no carotid bruit, supple, symmetrical, trachea midline and thyroid not enlarged, symmetric, no tenderness/mass/nodules ?Back: symmetric, no curvature. ROM normal. No CVA tenderness. ?Lungs: clear to auscultation bilaterally ?Heart: regular rate and rhythm, S1, S2 normal, no murmur, click, rub or gallop ?Abdomen: soft, non-tender; bowel sounds normal; no masses,  no organomegaly ?Pulses: 2+ and symmetric ?Skin: Skin color, texture, turgor normal. No rashes or lesions ?Lymph nodes: Cervical, supraclavicular, and axillary nodes normal. ? ?Lab Results  ?Component Value Date  ? HGBA1C 5.6 12/28/2021  ? HGBA1C 5.4 12/15/2020  ? ? ?Lab Results  ?Component Value Date  ? CREATININE 0.83 12/28/2021  ? CREATININE 0.85 06/30/2021  ? CREATININE 0.87 12/15/2020  ? ? ?Lab Results  ?Component Value Date  ? WBC 5.8 12/28/2021  ? HGB 13.4 12/28/2021  ? HCT 40.3 12/28/2021  ? PLT 138.0 (L) 12/28/2021  ? GLUCOSE 80 12/28/2021  ? CHOL 177 12/28/2021  ? TRIG 103.0 12/28/2021  ? HDL 69.00 12/28/2021  ? Index 88 12/28/2021  ? ALT 20 12/28/2021  ? AST 23 12/28/2021  ? NA 137 12/28/2021  ? K 4.6 12/28/2021  ? CL 103 12/28/2021  ? CREATININE 0.83  12/28/2021  ? BUN 13 12/28/2021  ? CO2 28 12/28/2021  ? TSH 1.53 12/28/2021  ? HGBA1C 5.6 12/28/2021  ? ? ?No results found. ? ?Assessment & Plan:  ? ?Problem List Items Addressed This Visit   ? ? Thrombocytopenia ([place])  ?  Stable,  No workup needed unless platelets fall below 50K ?  ?  ? Relevant Orders  ? CBC w/Diff  ? Osteoporosis  ?    Prior trials of alendronate  Was not tolerated due to pill esophagitis ,  evista caused headaches /migraines.  She is tolerating Prolia.  ?  ?  ? Left inguinal hernia  ?  S/p recent repair without complications  ?  ?  ? Hypothyroidism (acquired)  ? Relevant Orders  ? TSH  ? Hyperlipidemia with target LDL less than 130  ? Relevant Orders  ? Comp Met (CMET)  ? Lipid Profile  ? Generalized anxiety disorder with panic attacks  ?  Present for over a decade, managed now with sertraline .  increasing dose to 75 mg daily.  ?  ?  ? Relevant Medications  ? sertraline (ZOLOFT) 50 MG tablet  ? Atherosclerosis of aortic bifurcation and common iliac arteries ([place])  ?  Reviewed findings of prior LUMBAR SPINE FILM.  She is tolerating statin therpay starting with 20 mg ATORVASTAITN TWICE WEEKLY  ?  ?  ? ?Other Visit Diagnoses   ? ? Encounter for screening mammogram for malignant neoplasm of breast    -  Primary  ? Relevant Orders  ? MM 3D SCREEN BREAST BILATERAL  ? Impaired fasting glucose      ? Relevant Orders  ? HgB A1c  ? ?  ? ? ?I spent 30 minutes dedicated to the care of this patient on the date of this encounter to include pre-visit review of patient's medical history,  most recent imaging studies, Face-to-face time with the patient , and post visit ordering of testing and therapeutics.   ? ?Follow-up: Return in about 6 months (around 07/03/2022). ? ? ?Crecencio Mc, MD ?

## 2022-01-01 NOTE — Assessment & Plan Note (Signed)
Reviewed findings of prior LUMBAR SPINE FILM.  She is tolerating statin therpay starting with 20 mg ATORVASTAITN TWICE WEEKLY  ?

## 2022-01-01 NOTE — Assessment & Plan Note (Signed)
Stable,  No workup needed unless platelets fall below 50K ?

## 2022-01-01 NOTE — Assessment & Plan Note (Addendum)
Prior trials of alendronate  Was not tolerated due to pill esophagitis ,  evista caused headaches /migraines.  She is tolerating Prolia.  ?

## 2022-01-01 NOTE — Assessment & Plan Note (Addendum)
Present for over a decade, managed now with sertraline .  increasing dose to 75 mg daily.  ?

## 2022-01-01 NOTE — Assessment & Plan Note (Signed)
S/p recent repair without complications  ?

## 2022-01-03 ENCOUNTER — Ambulatory Visit (INDEPENDENT_AMBULATORY_CARE_PROVIDER_SITE_OTHER): Payer: Medicare HMO

## 2022-01-03 VITALS — Ht 63.5 in | Wt 158.0 lb

## 2022-01-03 DIAGNOSIS — Z0000 Encounter for general adult medical examination without abnormal findings: Secondary | ICD-10-CM | POA: Diagnosis not present

## 2022-01-03 NOTE — Progress Notes (Addendum)
Subjective:   Michelle Butler is a 81 y.o. female who presents for Medicare Annual (Subsequent) preventive examination.  Review of Systems    No ROS.  Medicare Wellness Virtual Visit.  Visual/audio telehealth visit, UTA vital signs.   See social history for additional risk factors.   Cardiac Risk Factors include: advanced age (>37men, >98 women)     Objective:    Today's Vitals   01/03/22 1321  Weight: 158 lb (71.7 kg)  Height: 5' 3.5" (1.613 m)   Body mass index is 27.55 kg/m.     01/03/2022    1:25 PM 10/15/2021   10:37 AM 10/08/2021   11:16 AM 12/16/2020   12:50 PM 12/16/2019   12:42 PM  Advanced Directives  Does Patient Have a Medical Advance Directive? Yes Yes Yes Yes Yes  Type of Estate agent of [place];Living will Healthcare Power of [place];Living will Healthcare Power of [place];Living will Healthcare Power of [place];Living will Healthcare Power of [place];Living will  Does patient want to make changes to medical advance directive? No - Patient declined No - Patient declined  No - Patient declined No - Patient declined  Copy of Healthcare Power of Attorney in Chart? Yes - validated most recent copy scanned in chart (See row information) Yes - validated most recent copy scanned in chart (See row information)  No - copy requested No - copy requested    Current Medications (verified) Outpatient Encounter Medications as of 01/03/2022  Medication Sig   acetaminophen (TYLENOL) 500 MG tablet Take 500 mg by mouth every 6 (six) hours as needed for moderate pain.   atorvastatin (LIPITOR) 20 MG tablet One tablet by mouth two times weekly (Patient taking differently: Take 20 mg by mouth 2 (two) times a week. Tuesday and Sat in PM)   calcium carbonate (TUMS - DOSED IN MG ELEMENTAL CALCIUM) 500 MG chewable tablet Chew 1 tablet by mouth as needed for indigestion or heartburn.   Carboxymeth-Glyc-Polysorb PF (REFRESH OPTIVE MEGA-3) 0.5-1-0.5 % SOLN Place 1  drop into both eyes in the morning.   Carboxymethylcellulose Sodium (REFRESH LIQUIGEL OP) Place 1 drop into both eyes at bedtime.   cholecalciferol (VITAMIN D3) 25 MCG (1000 UNIT) tablet Take 1,000 Units by mouth in the morning and at bedtime.   denosumab (PROLIA) 60 MG/ML SOSY injection Inject 60 mg into the skin every 6 (six) months.   levothyroxine (SYNTHROID) 100 MCG tablet TAKE 1 TABLET BY MOUTH EVERY DAY   LORazepam (ATIVAN) 1 MG tablet TAKE 1 TABLET BY MOUTH AT BEDTIME AS NEEDED FOR ANXIETY   metroNIDAZOLE (METROCREAM) 0.75 % cream Apply 1 application topically daily as needed (rosacea).   naproxen sodium (ALEVE) 220 MG tablet Take 220 mg by mouth daily as needed (pain).   Psyllium (METAMUCIL) 48.57 % POWD Take 1 Scoop by mouth daily.   sertraline (ZOLOFT) 50 MG tablet Take 1.5 tablets (75 mg total) by mouth daily.   vitamin B-12 (CYANOCOBALAMIN) 1000 MCG tablet Take 1,000 mcg by mouth daily.   Facility-Administered Encounter Medications as of 01/03/2022  Medication   denosumab (PROLIA) injection 60 mg    Allergies (verified) Citalopram, Duloxetine, and Shellfish allergy   History: Past Medical History:  Diagnosis Date   Anxiety    Cancer (HCC)    basal cell nose   GERD (gastroesophageal reflux disease)    History of methicillin resistant staphylococcus aureus (MRSA) 2012   Hyperlipidemia    Hypothyroidism    Past Surgical History:  Procedure Laterality Date  APPENDECTOMY  1959   COLONOSCOPY     HERNIA REPAIR  2013   Right side with mesh   INGUINAL HERNIA REPAIR Left 10/15/2021   Procedure: HERNIA REPAIR INGUINAL ADULT;  Surgeon: Earline Mayotte, MD;  Location: [facility] ORS;  Service: General;  Laterality: Left;   INSERTION OF MESH  10/15/2021   Procedure: INSERTION OF MESH;  Surgeon: Earline Mayotte, MD;  Location: [facility] ORS;  Service: General;;   left hip replacement  2011   right hip replacement  2000   TONSILLECTOMY  1959   TUBAL LIGATION  1982   VARICOSE VEIN  SURGERY  1991   Family History  Problem Relation Age of Onset   Peripheral Artery Disease Mother 7   Hip fracture Father 67   Melanoma Brother 59   Stroke Maternal Grandfather 15   Stroke Paternal Grandmother 51   Pneumonia Paternal Grandfather 82   Melanoma Brother    Breast cancer Other 53       [place]   Social History   Socioeconomic History   Marital status: Married    Spouse name: Not on file   Number of children: Not on file   Years of education: Not on file   Highest education level: Not on file  Occupational History   Not on file  Tobacco Use   Smoking status: Never   Smokeless tobacco: Never  Vaping Use   Vaping Use: Never used  Substance and Sexual Activity   Alcohol use: Yes    Alcohol/week: 2.0 standard drinks    Types: 2 Glasses of wine per week    Comment: wine rare   Drug use: Never   Sexual activity: Not on file  Other Topics Concern   Not on file  Social History Narrative   Married    [place]   Social Determinants of Health   Financial Resource Strain: Low Risk    Difficulty of Paying Living Expenses: Not hard at all  Food Insecurity: No Food Insecurity   Worried About Programme researcher, broadcasting/film/video in the Last Year: Never true   Barista in the Last Year: Never true  Transportation Needs: No Transportation Needs   Lack of Transportation (Medical): No   Lack of Transportation (Non-Medical): No  Physical Activity: Sufficiently Active   Days of Exercise per Week: 5 days   Minutes of Exercise per Session: 60 min  Stress: No Stress Concern Present   Feeling of Stress : Not at all  Social Connections: Unknown   Frequency of Communication with Friends and Family: Not on file   Frequency of Social Gatherings with Friends and Family: Not on file   Attends Religious Services: Not on Scientist, clinical (histocompatibility and immunogenetics) or Organizations: Not on file   Attends Banker Meetings: Not on file   Marital Status: Married    Tobacco  Counseling Counseling given: Not Answered   Clinical Intake:  Pre-visit preparation completed: Yes        Diabetes: No  How often do you need to have someone help you when you read instructions, pamphlets, or other written materials from your doctor or pharmacy?: 1 - Never  Interpreter Needed?: No     Activities of Daily Living    01/03/2022    1:27 PM 10/08/2021   11:07 AM  In your present state of health, do you have any difficulty performing the following activities:  Hearing? 0   Vision? 0  Difficulty concentrating or making decisions? 0   Walking or climbing stairs? 0   Dressing or bathing? 0   Doing errands, shopping? 0 0  Preparing Food and eating ? N   Using the Toilet? N   In the past six months, have you accidently leaked urine? N   Do you have problems with loss of bowel control? N   Managing your Medications? N   Managing your Finances? N   Housekeeping or managing your Housekeeping? N     Patient Care Team: Sherlene Shams, MD as PCP - General (Internal Medicine)  Indicate any recent Medical Services you may have received from other than [facility] providers in the past year (date may be approximate).     Assessment:   This is a routine wellness examination for [facility].  Virtual Visit via Telephone Note  I connected with  Assunta Gambles on 01/03/22 at  1:15 PM EDT by telephone and verified that I am speaking with the correct person using two identifiers.  Persons participating in the virtual visit: patient/Nurse Health Advisor   I discussed the limitations of performing an evaluation and management service by telehealth. The patient expressed understanding and agreed to proceed. We continued and completed visit with audio only. Some vital signs may be absent or patient reported.   Hearing/Vision screen Hearing Screening - Comments:: Patient is able to hear conversational tones without difficulty.  No issues reported. Vision Screening - Comments::  Followed by [facility] Wears corrective lenses Cataract extraction, bilateral They have seen their ophthalmologist in the last 12 months.    Dietary issues and exercise activities discussed: Current Exercise Habits: Home exercise routine, Type of exercise: walking, Frequency (Times/Week): 3, Intensity: Mild   Goals Addressed               This Visit's Progress     Patient Stated     Weight (lb) < 157 lb (71.2 kg) (pt-stated)   158 lb (71.7 kg)     I would like to weigh around 140-145lb. Eat healthier, portion control Reduce sugar intake       Depression Screen    01/03/2022    1:24 PM 07/02/2021    9:28 AM 04/27/2021   12:59 PM 12/17/2020    9:37 AM 12/16/2020   12:44 PM 12/16/2019   12:43 PM 11/07/2019    9:23 AM  PHQ 2/9 Scores  PHQ - 2 Score 0 1 0 0 0 0 0    Fall Risk    01/03/2022    1:27 PM 07/02/2021    9:27 AM 04/27/2021   12:59 PM 12/17/2020    9:37 AM 12/16/2020   12:52 PM  Fall Risk   Falls in the past year? 0 0 0 0 0  Number falls in past yr: 0  0  0  Injury with Fall?   0  0  Follow up Falls evaluation completed Falls evaluation completed Falls evaluation completed Falls evaluation completed Falls evaluation completed    FALL RISK PREVENTION PERTAINING TO THE HOME: Home free of loose throw rugs in walkways, pet beds, electrical cords, etc? Yes  Adequate lighting in your home to reduce risk of falls? Yes   ASSISTIVE DEVICES UTILIZED TO PREVENT FALLS: Use of a cane, walker or w/c? No   TIMED UP AND GO: Was the test performed? No .   Cognitive Function:        12/16/2019    1:00 PM  6CIT Screen  What Year?  0 points  What month? 0 points  What time? 0 points  Count back from 20 0 points  Months in reverse 0 points  Repeat phrase 0 points  Total Score 0 points    Immunizations Immunization History  Administered Date(s) Administered   Fluad Quad(high Dose 65+) 06/18/2019, 07/02/2021   Hepatitis A 06/10/2005   Influenza-Unspecified  07/16/2020   Moderna Covid-19 Vaccine Bivalent Booster 61yrs & up 06/28/2021   Moderna Sars-Covid-2 Vaccination 10/22/2019, 11/19/2019, 08/25/2020, 02/25/2021   Pneumococcal Conjugate-13 07/10/2014   Pneumococcal Polysaccharide-23 07/10/2002, 08/25/2008, 06/11/2019   Tdap 08/15/2008, 05/29/2020   Zoster Recombinat (Shingrix) 07/10/2019, 09/23/2019   Zoster, Live 01/14/2010   Screening Tests Health Maintenance  Topic Date Due   TETANUS/TDAP  05/29/2030   Pneumonia Vaccine 16+ Years old  Completed   INFLUENZA VACCINE  Completed   DEXA SCAN  Completed   COVID-19 Vaccine  Completed   Zoster Vaccines- Shingrix  Completed   HPV VACCINES  Aged Out   Health Maintenance There are no preventive care reminders to display for this patient.  Mammogram- agrees to schedule  Lung Cancer Screening: (Low Dose CT Chest recommended if Age 45-80 years, 30 pack-year currently smoking OR have quit w/in 15years.) does not qualify.   Vision Screening: Recommended annual ophthalmology exams for early detection of glaucoma and other disorders of the eye.  Dental Screening: Recommended annual dental exams for proper oral hygiene  Community Resource Referral / Chronic Care Management: CRR required this visit?  No   CCM required this visit?  No      Plan:   Keep all routine maintenance appointments.   I have personally reviewed and noted the following in the patient's chart:   Medical and social history Use of alcohol, tobacco or illicit drugs  Current medications and supplements including opioid prescriptions.  Functional ability and status Nutritional status Physical activity Advanced directives List of other physicians Hospitalizations, surgeries, and ER visits in previous 12 months Vitals Screenings to include cognitive, depression, and falls Referrals and appointments  In addition, I have reviewed and discussed with patient certain preventive protocols, quality metrics, and best  practice recommendations. A written personalized care plan for preventive services as well as general preventive health recommendations were provided to patient.     OBrien-Blaney, Devante Capano L, LPN   1/61/0960    I have reviewed the above information and agree with above.   Duncan Dull, MD

## 2022-01-03 NOTE — Patient Instructions (Addendum)
?  Michelle Butler , ?Thank you for taking time to come for your Medicare Wellness Visit. I appreciate your ongoing commitment to your health goals. Please review the following plan we discussed and let me know if I can assist you in the future.  ? ?These are the goals we discussed: ? Goals   ? ?  ? Patient Stated  ?   Weight (lb) < 157 lb (71.2 kg) (pt-stated)   ?   I would like to weigh around 140-145lb. ?Eat healthier, portion control ?Reduce sugar intake ?  ? ?  ?  ?This is a list of the screening recommended for you and due dates:  ?Health Maintenance  ?Topic Date Due  ? Tetanus Vaccine  05/29/2030  ? Pneumonia Vaccine  Completed  ? Flu Shot  Completed  ? DEXA scan (bone density measurement)  Completed  ? COVID-19 Vaccine  Completed  ? Zoster (Shingles) Vaccine  Completed  ? HPV Vaccine  Aged Out  ?  ?

## 2022-01-17 ENCOUNTER — Encounter: Payer: Self-pay | Admitting: Internal Medicine

## 2022-01-21 ENCOUNTER — Encounter: Payer: Self-pay | Admitting: Internal Medicine

## 2022-02-07 ENCOUNTER — Telehealth: Payer: Self-pay | Admitting: *Deleted

## 2022-02-07 NOTE — Telephone Encounter (Signed)
Due Prolia now Laquashia, [place] ? ?New Id :[id number] ? ?Old Id : ?DOB:01-02-1941 ? ?[address], [place], [place], [address] ? ?[facility] ?Current case status ?BENEFIT VERIFICATION IN PROGRESS ? ?

## 2022-02-09 ENCOUNTER — Ambulatory Visit
Admission: RE | Admit: 2022-02-09 | Discharge: 2022-02-09 | Disposition: A | Discharge status: Home / Self Care | Payer: Medicare HMO | Source: Ambulatory Visit | Attending: Internal Medicine | Admitting: Internal Medicine

## 2022-02-09 DIAGNOSIS — Z1231 Encounter for screening mammogram for malignant neoplasm of breast: Secondary | ICD-10-CM | POA: Diagnosis not present

## 2022-02-14 NOTE — Telephone Encounter (Signed)
PA for patient on your key board faxed today. ?

## 2022-02-15 NOTE — Telephone Encounter (Signed)
Per [employer] ?Prolia PA: APPROVED ?From 02/14/22-02/15/23 ?

## 2022-02-15 NOTE — Telephone Encounter (Signed)
Called pt to get her scheduled for her Prolia inj after 03/06/22 & pt states that after discuss dental procedure with Dr. Derrel Nip they have agreed that it would be best to hold off until November. ? ?Pt aware that authorization is good for the remainder of the year & $0. ? ?Pt will call to schedule in November at a later date. ?

## 2022-04-04 ENCOUNTER — Other Ambulatory Visit: Payer: Self-pay | Admitting: Internal Medicine

## 2022-04-27 ENCOUNTER — Encounter: Payer: Self-pay | Admitting: Internal Medicine

## 2022-04-27 NOTE — Progress Notes (Signed)
    SUBJECTIVE:   CHIEF COMPLAINT / HPI: Suture removal  Patient presents to clinic for suture removal of right forehead.  Had been seen in ED on 07/18 for acute laceration of forehead after hitting head on brick after falling while digging plants.  No LOC at time of incident.  Imaging in the ED reviewed.  CT head and C-spine negative for any acute changes.  Left lower abdominal bulge Patient reports having hernia surgery back in January 2023.  Noticed lower left abdominal bulge while standing few months later.  Has had follow-up visit with Dr. Bary Castilla status post surgery.  Denies any pain.  Is taking some Metamucil which helped.  PERTINENT  PMH / PSH:  Status post left inguinal hernia repair 2022  OBJECTIVE:   BP 118/72 (BP Location: Left Arm, Patient Position: Sitting, Cuff Size: Normal)   Pulse 60   Temp 97.6 F (36.4 C) (Oral)   Ht 5' 3.5" (1.613 m)   Wt 157 lb 12.8 oz (71.6 kg)   SpO2 99%   BMI 27.51 kg/m    General: Alert, no acute distress Right forehead: Healing wound with scab.  No discharge, erythema or edema appreciated. Abdomen: Bowel sounds normal. Abdomen soft and non-tender.  Mild abdominal fullness left lower quadrant on standing it does not seem to be reproducible when lying down.  ASSESSMENT/PLAN:   Encounter for removal of sutures Verbal consent obtained for removal of sutures. Area cleansed with warm water. 3 sutures removed from right forehead laceration.  No gaping wound, erythema, discharge or edema. Patient tolerated procedure well. Educated regarding signs symptoms of infection Strict return precautions provided.  Left inguinal hernia Patient is currently asymptomatic.  Do not think this is a recurrence of her previous left inguinal hernia.  Discussed with patient that likely could be some weakening of  musculature of abdominal wall and given that she is currently asymptomatic do not think imaging is necessary at this time.  No peritoneal symptoms on  exam.  Patient in agreement with plan.  She has a follow-up appointment with PCP in September.  Discussed that if symptoms worsen to follow-up sooner in clinic.    PDMP reviewed  Carollee Leitz, MD [facility]

## 2022-05-02 ENCOUNTER — Ambulatory Visit (INDEPENDENT_AMBULATORY_CARE_PROVIDER_SITE_OTHER): Payer: Medicare HMO | Admitting: Family Medicine

## 2022-05-02 ENCOUNTER — Encounter: Payer: Self-pay | Admitting: Family Medicine

## 2022-05-02 DIAGNOSIS — K4090 Unilateral inguinal hernia, without obstruction or gangrene, not specified as recurrent: Secondary | ICD-10-CM | POA: Diagnosis not present

## 2022-05-02 DIAGNOSIS — Z4802 Encounter for removal of sutures: Secondary | ICD-10-CM

## 2022-05-02 NOTE — Assessment & Plan Note (Signed)
Patient is currently asymptomatic.  Do not think this is a recurrence of her previous left inguinal hernia.  Discussed with patient that likely could be some weakening of  musculature of abdominal wall and given that she is currently asymptomatic do not think imaging is necessary at this time.  No peritoneal symptoms on exam.  Patient in agreement with plan.  She has a follow-up appointment with PCP in September.  Discussed that if symptoms worsen to follow-up sooner in clinic.

## 2022-05-02 NOTE — Assessment & Plan Note (Signed)
Verbal consent obtained for removal of sutures. Area cleansed with warm water. 3 sutures removed from right forehead laceration.  No gaping wound, erythema, discharge or edema. Patient tolerated procedure well. Educated regarding signs symptoms of infection Strict return precautions provided.

## 2022-05-02 NOTE — Patient Instructions (Addendum)
It was a pleasure meeting you today. Thank you for allowing me to take part in your health care.  Our goals for today as we discussed include:  I removed 3 sutures from your scalp.  You can apply Vaseline to the area as needed.  Do not pick off the scab as this helps to heal the area.  Continue to monitor for any signs of infection, increased redness, swelling, pain, fevers or discharge from the area.  If you develop any of the symptoms please return to clinic to be evaluated by your PCP.  For your lower abdominal bulge. I do not think that this is a worsening of your previous hernia.  If you start having any symptoms in your lower abdomen please discuss with your PCP or follow-up with Dr. Bary Castilla  Please follow-up with PCP in September to schedule  If you have any questions or concerns, please do not hesitate to call the office at [phone].  I look forward to our next visit and until then take care and stay safe.  Regards,   Carollee Leitz, MD   [facility]

## 2022-06-06 ENCOUNTER — Other Ambulatory Visit: Payer: Self-pay | Admitting: Otolaryngology

## 2022-06-06 DIAGNOSIS — H905 Unspecified sensorineural hearing loss: Secondary | ICD-10-CM

## 2022-06-21 ENCOUNTER — Ambulatory Visit
Admission: RE | Admit: 2022-06-21 | Discharge: 2022-06-21 | Disposition: A | Discharge status: Home / Self Care | Payer: Medicare HMO | Source: Ambulatory Visit | Attending: Otolaryngology | Admitting: Otolaryngology

## 2022-06-21 DIAGNOSIS — H905 Unspecified sensorineural hearing loss: Secondary | ICD-10-CM

## 2022-06-21 MED ORDER — GADOBENATE DIMEGLUMINE 529 MG/ML IV SOLN
14.0000 mL | Freq: Once | INTRAVENOUS | Status: AC | PRN
  Administered 2022-06-21: 14 mL via INTRAVENOUS

## 2022-06-25 ENCOUNTER — Emergency Department: Payer: Medicare HMO

## 2022-06-25 ENCOUNTER — Other Ambulatory Visit: Payer: Self-pay

## 2022-06-25 ENCOUNTER — Emergency Department
Admission: EM | Admit: 2022-06-25 | Discharge: 2022-06-26 | Disposition: A | Discharge status: Home / Self Care | Payer: Medicare HMO | Attending: Emergency Medicine | Admitting: Emergency Medicine

## 2022-06-25 DIAGNOSIS — W1839XA Other fall on same level, initial encounter: Secondary | ICD-10-CM | POA: Insufficient documentation

## 2022-06-25 DIAGNOSIS — Y92002 Bathroom of unspecified non-institutional (private) residence single-family (private) house as the place of occurrence of the external cause: Secondary | ICD-10-CM | POA: Diagnosis not present

## 2022-06-25 DIAGNOSIS — S42292A Other displaced fracture of upper end of left humerus, initial encounter for closed fracture: Secondary | ICD-10-CM | POA: Diagnosis not present

## 2022-06-25 DIAGNOSIS — S4292XA Fracture of left shoulder girdle, part unspecified, initial encounter for closed fracture: Secondary | ICD-10-CM

## 2022-06-25 DIAGNOSIS — W19XXXA Unspecified fall, initial encounter: Secondary | ICD-10-CM

## 2022-06-25 DIAGNOSIS — S4992XA Unspecified injury of left shoulder and upper arm, initial encounter: Secondary | ICD-10-CM | POA: Diagnosis present

## 2022-06-25 LAB — BASIC METABOLIC PANEL
Anion gap: 7 (ref 5–15)
BUN: 22 mg/dL (ref 8–23)
CO2: 27 mmol/L (ref 22–32)
Calcium: 9.5 mg/dL (ref 8.9–10.3)
Chloride: 104 mmol/L (ref 98–111)
Creatinine, Ser: 1.11 mg/dL — ABNORMAL HIGH (ref 0.44–1.00)
GFR, Estimated: 50 mL/min — ABNORMAL LOW (ref >60)
Glucose, Bld: 110 mg/dL — ABNORMAL HIGH (ref 70–99)
Potassium: 3.9 mmol/L (ref 3.5–5.1)
Sodium: 138 mmol/L (ref 135–145)

## 2022-06-25 LAB — CBC WITH DIFFERENTIAL/PLATELET
Abs Immature Granulocytes: 0.02 10*3/uL (ref 0.00–0.07)
Basophils Absolute: 0.1 10*3/uL (ref 0.0–0.1)
Basophils Relative: 1 %
Eosinophils Absolute: 0.1 10*3/uL (ref 0.0–0.5)
Eosinophils Relative: 1 %
HCT: 40.4 % (ref 36.0–46.0)
Hemoglobin: 13.2 g/dL (ref 12.0–15.0)
Immature Granulocytes: 0 %
Lymphocytes Relative: 17 %
Lymphs Abs: 1.6 10*3/uL (ref 0.7–4.0)
MCH: 31.1 pg (ref 26.0–34.0)
MCHC: 32.7 g/dL (ref 30.0–36.0)
MCV: 95.1 fL (ref 80.0–100.0)
Monocytes Absolute: 1.1 10*3/uL — ABNORMAL HIGH (ref 0.1–1.0)
Monocytes Relative: 12 %
Neutro Abs: 6.3 10*3/uL (ref 1.7–7.7)
Neutrophils Relative %: 69 %
Platelets: 150 10*3/uL (ref 150–400)
RBC: 4.25 MIL/uL (ref 3.87–5.11)
RDW: 12.9 % (ref 11.5–15.5)
WBC: 9.2 10*3/uL (ref 4.0–10.5)
nRBC: 0 % (ref 0.0–0.2)

## 2022-06-25 MED ORDER — OXYCODONE HCL 5 MG PO TABS
5.0000 mg | ORAL_TABLET | Freq: Once | ORAL | Status: AC
  Administered 2022-06-25: 5 mg via ORAL
  Filled 2022-06-25: qty 1

## 2022-06-25 MED ORDER — SODIUM CHLORIDE 0.9 % IV BOLUS
500.0000 mL | Freq: Once | INTRAVENOUS | Status: AC
  Administered 2022-06-25: 500 mL via INTRAVENOUS

## 2022-06-25 NOTE — ED Provider Notes (Addendum)
[facility] Provider Note    Event Date/Time   First MD Initiated Contact with Patient 06/25/22 2117     (approximate)   History   Fall (Pt arrived via EMS from home for c/o fall at home. Pt reports mechanical fall in bathroom of home while changing into pajamas and states her feet just fell from under her causing her to land on floor onto L side. Pt denies hitting head, denies LOC, denies blood thinners, denies dizziness or lightheaded sx's. Pt arrived w/ LUE in sling from home, no obvious deformities noted, all skin in tact. )   HPI  Michelle Butler is a 81 y.o. female who comes in from home after a fall.  Patient was having a mechanical fall in the bathroom at home a change into pajamas where they felt like her feet fell from underneath her and she landed onto the floor onto her left side.  According to the husband he reports that it was really dark in the room and that she had nothing that she could balance herself on and she was trying to take off pants without any lights on and that is why she fell.  She is adamant that she did not hit her head did not lose consciousness denies any midline neck pain.  She reports a little bit of left hip pain but still able to lift her hip off the bed.  She is her only concern is her shoulder pain on the left.    Physical Exam   Triage Vital Signs: ED Triage Vitals  Enc Vitals Group     BP 06/25/22 2042 132/82     Pulse Rate 06/25/22 2042 74     Resp 06/25/22 2042 16     Temp 06/25/22 2042 97.6 F (36.4 C)     Temp Source 06/25/22 2042 Oral     SpO2 06/25/22 2042 100 %     Weight 06/25/22 2040 155 lb (70.3 kg)     Height 06/25/22 2040 '5\' 3"'$  (1.6 m)     Head Circumference --      Peak Flow --      Pain Score 06/25/22 2040 6     Pain Loc --      Pain Edu? --      Excl. in [place]? --     Most recent vital signs: Vitals:   06/25/22 2055 06/25/22 2057  BP: (S) 132/70 (S) 109/70  Pulse: 70 79  Resp: 15 15  Temp:     SpO2: 98% 98%     General: Awake, no distress.  CV:  Good peripheral perfusion.  Resp:  Normal effort.  Abd:  No distention.  Other:  Patient is tenderness on the left shoulder.  Good distal pulse.  Radial ulnar median nerve are all intact No hematoma.  No C-spine tenderness.  ED Results / Procedures / Treatments   Labs (all labs ordered are listed, but only abnormal results are displayed) Labs Reviewed  CBC WITH DIFFERENTIAL/PLATELET  BASIC METABOLIC PANEL     EKG  My interpretation of EKG:   Normal sinus rate of 72 without any ST elevation or T wave inversions, normal intervals   RADIOLOGY I have reviewed the xray personally and interpreted and patient has a fracture of her humerus   PROCEDURES:  Critical Care performed: No  Procedures   MEDICATIONS ORDERED IN ED: Medications  oxyCODONE (Oxy IR/ROXICODONE) immediate release tablet 5 mg (has no administration in time range)  sodium  chloride 0.9 % bolus 500 mL (500 mLs Intravenous New Bag/Given 06/25/22 2211)     IMPRESSION / MDM / ASSESSMENT AND PLAN / ED COURSE  I reviewed the triage vital signs and the nursing notes.   Patient's presentation is most consistent with acute presentation with potential threat to life or bodily function.   Patient comes in for mechanical fall.  Labs are ordered due to concern from possible orthostatics although patient really denies any other symptoms.  Patient was given a little bit of fluids and labs were checked evaluate for any AKI that could be contributing to the fall.  She is adamant that she did not hit her head has no C-spine tenderness and does not want to proceed with any imaging of this.  She does have pain of her left shoulder which x-ray does show humeral head fracture.  Patient was placed in a splint and given some oxycodone for pain.  Patient reports having hydrocodone at home.  We discussed ibuprofen for pain and using the hydrocodone for breakthrough pain.  She  expressed understanding will follow-up with orthopedics outpatient.  CBC is slightly elevated creatinine but patient was given some fluids for that.  CBC is reassuring.  I reviewed the notes where patient had follow-up on 7/24 by her primary care doctor.  I reevaluated the patient again and she is adamant that she has no C-spine tenderness, full range of motion of neck.  No numbness or tingling.  She declined CT imaging of head and neck.  She reports already have pain medication at home.   We will do ambulation trial and if doing well patient can be discharged home  FINAL CLINICAL IMPRESSION(S) / ED DIAGNOSES   Final diagnoses:  Fall, initial encounter  Closed fracture of left shoulder, initial encounter     Rx / DC Orders   ED Discharge Orders     None        Note:  This document was prepared using Dragon voice recognition software and may include unintentional dictation errors.   Vanessa [place], MD 06/25/22 2344    Vanessa [place], MD 06/25/22 [phone]

## 2022-06-25 NOTE — Discharge Instructions (Addendum)
Call your orthopedic doctor to make a follow-up appointment and use the spling.  You can return to the ER if you develop worsening symptoms or any other concerns  IMPRESSION: 1. Acute comminuted and displaced proximal humeral fracture involving the greater tuberosity and neck. 2. No other acute displaced fracture of the bones of the left shoulder and humerus.

## 2022-06-26 NOTE — ED Notes (Signed)
DC instructions given verbally and in writing.. understanding voiced, signature obtained.  Pt left in stable condition with family to POV.

## 2022-07-04 ENCOUNTER — Other Ambulatory Visit (INDEPENDENT_AMBULATORY_CARE_PROVIDER_SITE_OTHER): Payer: Medicare HMO

## 2022-07-04 DIAGNOSIS — E785 Hyperlipidemia, unspecified: Secondary | ICD-10-CM | POA: Diagnosis not present

## 2022-07-04 DIAGNOSIS — R7301 Impaired fasting glucose: Secondary | ICD-10-CM | POA: Diagnosis not present

## 2022-07-04 DIAGNOSIS — E039 Hypothyroidism, unspecified: Secondary | ICD-10-CM

## 2022-07-04 DIAGNOSIS — D696 Thrombocytopenia, unspecified: Secondary | ICD-10-CM

## 2022-07-04 LAB — COMPREHENSIVE METABOLIC PANEL
ALT: 19 U/L (ref 0–35)
AST: 21 U/L (ref 0–37)
Albumin: 3.8 g/dL (ref 3.5–5.2)
Alkaline Phosphatase: 45 U/L (ref 39–117)
BUN: 21 mg/dL (ref 6–23)
CO2: 26 mEq/L (ref 19–32)
Calcium: 9 mg/dL (ref 8.4–10.5)
Chloride: 104 mEq/L (ref 96–112)
Creatinine, Ser: 0.92 mg/dL (ref 0.40–1.20)
GFR: 58.31 mL/min — ABNORMAL LOW (ref >60.00)
Glucose, Bld: 87 mg/dL (ref 70–99)
Potassium: 4.3 mEq/L (ref 3.5–5.1)
Sodium: 139 mEq/L (ref 135–145)
Total Bilirubin: 0.6 mg/dL (ref 0.2–1.2)
Total Protein: 6.4 g/dL (ref 6.0–8.3)

## 2022-07-04 LAB — CBC WITH DIFFERENTIAL/PLATELET
Basophils Absolute: 0 10*3/uL (ref 0.0–0.1)
Basophils Relative: 0.6 % (ref 0.0–3.0)
Eosinophils Absolute: 0.1 10*3/uL (ref 0.0–0.7)
Eosinophils Relative: 1.4 % (ref 0.0–5.0)
HCT: 33 % — ABNORMAL LOW (ref 36.0–46.0)
Hemoglobin: 11.4 g/dL — ABNORMAL LOW (ref 12.0–15.0)
Lymphocytes Relative: 27 % (ref 12.0–46.0)
Lymphs Abs: 1.3 10*3/uL (ref 0.7–4.0)
MCHC: 34.4 g/dL (ref 30.0–36.0)
MCV: 92.9 fl (ref 78.0–100.0)
Monocytes Absolute: 0.6 10*3/uL (ref 0.1–1.0)
Monocytes Relative: 11.5 % (ref 3.0–12.0)
Neutro Abs: 2.9 10*3/uL (ref 1.4–7.7)
Neutrophils Relative %: 59.5 % (ref 43.0–77.0)
Platelets: 175 10*3/uL (ref 150.0–400.0)
RBC: 3.56 Mil/uL — ABNORMAL LOW (ref 3.87–5.11)
RDW: 13.3 % (ref 11.5–15.5)
WBC: 4.9 10*3/uL (ref 4.0–10.5)

## 2022-07-04 LAB — LIPID PANEL
Cholesterol: 146 mg/dL (ref 0–200)
HDL: 53.6 mg/dL (ref >39.00)
LDL Cholesterol: 72 mg/dL (ref 0–99)
NonHDL: 92.83
Total CHOL/HDL Ratio: 3
Triglycerides: 106 mg/dL (ref 0.0–149.0)
VLDL: 21.2 mg/dL (ref 0.0–40.0)

## 2022-07-04 LAB — HEMOGLOBIN A1C: Hgb A1c MFr Bld: 5.5 % (ref 4.6–6.5)

## 2022-07-04 LAB — TSH: TSH: 2.86 u[IU]/mL (ref 0.35–5.50)

## 2022-07-07 ENCOUNTER — Encounter: Payer: Self-pay | Admitting: Internal Medicine

## 2022-07-07 ENCOUNTER — Ambulatory Visit (INDEPENDENT_AMBULATORY_CARE_PROVIDER_SITE_OTHER): Payer: Medicare HMO | Admitting: Internal Medicine

## 2022-07-07 VITALS — BP 118/70 | HR 78 | Temp 97.7°F | Ht 63.5 in

## 2022-07-07 DIAGNOSIS — M816 Localized osteoporosis [Lequesne]: Secondary | ICD-10-CM | POA: Diagnosis not present

## 2022-07-07 DIAGNOSIS — S42352A Displaced comminuted fracture of shaft of humerus, left arm, initial encounter for closed fracture: Secondary | ICD-10-CM

## 2022-07-07 DIAGNOSIS — E559 Vitamin D deficiency, unspecified: Secondary | ICD-10-CM | POA: Diagnosis not present

## 2022-07-07 DIAGNOSIS — S42355S Nondisplaced comminuted fracture of shaft of humerus, left arm, sequela: Secondary | ICD-10-CM

## 2022-07-07 DIAGNOSIS — D649 Anemia, unspecified: Secondary | ICD-10-CM | POA: Diagnosis not present

## 2022-07-07 DIAGNOSIS — R296 Repeated falls: Secondary | ICD-10-CM

## 2022-07-07 DIAGNOSIS — M8000XS Age-related osteoporosis with current pathological fracture, unspecified site, sequela: Secondary | ICD-10-CM | POA: Diagnosis not present

## 2022-07-07 DIAGNOSIS — I700 Atherosclerosis of aorta: Secondary | ICD-10-CM

## 2022-07-07 DIAGNOSIS — F5105 Insomnia due to other mental disorder: Secondary | ICD-10-CM

## 2022-07-07 DIAGNOSIS — I708 Atherosclerosis of other arteries: Secondary | ICD-10-CM

## 2022-07-07 DIAGNOSIS — F409 Phobic anxiety disorder, unspecified: Secondary | ICD-10-CM

## 2022-07-07 HISTORY — DX: Displaced comminuted fracture of shaft of humerus, left arm, initial encounter for closed fracture: S42.352A

## 2022-07-07 LAB — IBC + FERRITIN
Ferritin: 121.7 ng/mL (ref 10.0–291.0)
Iron: 74 ug/dL (ref 42–145)
Saturation Ratios: 27.4 % (ref 20.0–50.0)
TIBC: 270.2 ug/dL (ref 250.0–450.0)
Transferrin: 193 mg/dL — ABNORMAL LOW (ref 212.0–360.0)

## 2022-07-07 LAB — B12 AND FOLATE PANEL
Folate: 10.9 ng/mL (ref >5.9)
Vitamin B-12: 1136 pg/mL — ABNORMAL HIGH (ref 211–911)

## 2022-07-07 LAB — VITAMIN D 25 HYDROXY (VIT D DEFICIENCY, FRACTURES): VITD: 31.91 ng/mL (ref 30.00–100.00)

## 2022-07-07 NOTE — Progress Notes (Signed)
Subjective:  Patient ID: Michelle Butler, female    DOB: 09-20-1941  Age: 81 y.o. MRN: [id number]  CC: The primary encounter diagnosis was Localized osteoporosis without current pathological fracture. Diagnoses of Age-related osteoporosis with current pathological fracture, sequela, Anemia, unspecified type, Atherosclerosis of aortic bifurcation and common iliac arteries ([place]), Vitamin D deficiency, Insomnia due to anxiety and fear, Frequent falls, and Closed nondisplaced comminuted fracture of shaft of left humerus, sequela were also pertinent to this visit.   HPI Ericha Whittingham presents for follow up on multiple issues.  She is accompanied by her daughter Jeannett Senior.  Chief Complaint  Patient presents with   Follow-up    38 months    81 yr old woman with osteoporosis ,  managed with Prolia,  presents with the sequelae of her 3rd fall in  6 months:  1) June: [place] in [place] , ankle rolled and fell in the sand 2) July: digging plants out of yard shovel twisted , she lost balance and fell and hit a brick wall and lacerated scalp, injured right shoulder ,  has decreased rom and discomfort with extreme ROM  3) last week:  while removing clothes for bed she tripped and fractured her left arm at the neck of the humerus sept 16.  [facility]aw [facility] on sept 20th.   No plans for surgery  using a sling and started PT yesterday .  Using naproxen  but has not tried tylenol    2) Insomnia managed with low dose 1/2 ) tablet of lorazepam   3) Hearing loss in left ear ( tested,  reduced to  40%)  happened quickly so ENT ordered  and MRI with IAC's  No schwannoma   4) Osteoporosis : has been taking Prolia but suspended last dose due to oral surgery,   5) Left inguinal hernia repair  with mesh in January 2023 by Bary Castilla.  Worried about a persistent bulge. Had repeat evaluation by Dr Bary Castilla  in April , who felt that recurrence was unlikely and that the fulllness was due to lateral  abdominal wall weakness  Observations was recommended.   The bulge is  reducible and not painful  Outpatient Medications Prior to Visit  Medication Sig Dispense Refill   acetaminophen (TYLENOL) 500 MG tablet Take 500 mg by mouth every 6 (six) hours as needed for moderate pain.     atorvastatin (LIPITOR) 20 MG tablet One tablet by mouth two times weekly (Patient taking differently: Take 20 mg by mouth 2 (two) times a week. Tuesday and Sat in PM) 24 tablet 3   calcium carbonate (TUMS - DOSED IN MG ELEMENTAL CALCIUM) 500 MG chewable tablet Chew 1 tablet by mouth as needed for indigestion or heartburn.     Carboxymeth-Glyc-Polysorb PF (REFRESH OPTIVE MEGA-3) 0.5-1-0.5 % SOLN Place 1 drop into both eyes in the morning.     Carboxymethylcellulose Sodium (REFRESH LIQUIGEL OP) Place 1 drop into both eyes at bedtime.     cholecalciferol (VITAMIN D3) 25 MCG (1000 UNIT) tablet Take 1,000 Units by mouth in the morning and at bedtime.     denosumab (PROLIA) 60 MG/ML SOSY injection Inject 60 mg into the skin every 6 (six) months.     levothyroxine (SYNTHROID) 100 MCG tablet TAKE 1 TABLET BY MOUTH EVERY DAY 90 tablet 1   LORazepam (ATIVAN) 1 MG tablet TAKE 1 TABLET BY MOUTH AT BEDTIME AS NEEDED FOR ANXIETY 30 tablet 2   metroNIDAZOLE (METROCREAM) 0.75 %  cream Apply 1 application topically daily as needed (rosacea).     naproxen sodium (ALEVE) 220 MG tablet Take 220 mg by mouth daily as needed (pain).     Psyllium (METAMUCIL) 48.57 % POWD Take 1 Scoop by mouth daily.     sertraline (ZOLOFT) 50 MG tablet Take 1.5 tablets (75 mg total) by mouth daily. 135 tablet 1   vitamin B-12 (CYANOCOBALAMIN) 1000 MCG tablet Take 1,000 mcg by mouth daily.     Facility-Administered Medications Prior to Visit  Medication Dose Route Frequency Provider Last Rate Last Admin   denosumab (PROLIA) injection 60 mg  60 mg Subcutaneous Q6 months Crecencio Mc, MD   60 mg at 09/06/21 1544    Review of Systems;  Patient denies  headache, fevers, malaise, unintentional weight loss, skin rash, eye pain, sinus congestion and sinus pain, sore throat, dysphagia,  hemoptysis , cough, dyspnea, wheezing, chest pain, palpitations, orthopnea, edema, abdominal pain, nausea, melena, diarrhea, constipation, flank pain, dysuria, hematuria, urinary  Frequency, nocturia, numbness, tingling, seizures,  Focal weakness, Loss of consciousness,  Tremor, insomnia, depression, anxiety, and suicidal ideation.      Objective:  BP 118/70 (BP Location: Left Arm, Patient Position: Sitting, Cuff Size: Normal)   Pulse 78   Temp 97.7 F (36.5 C) (Oral)   Ht 5' 3.5" (1.613 m)   SpO2 98%   BMI 27.03 kg/m   BP Readings from Last 3 Encounters:  07/07/22 118/70  06/26/22 119/82  05/02/22 118/72    Wt Readings from Last 3 Encounters:  06/25/22 155 lb (70.3 kg)  05/02/22 157 lb 12.8 oz (71.6 kg)  01/03/22 158 lb (71.7 kg)    General appearance: alert, cooperative and appears stated age Ears: normal TM's and external ear canals both ears Throat: lips, mucosa, and tongue normal; teeth and gums normal Neck: no adenopathy, no carotid bruit, supple, symmetrical, trachea midline and thyroid not enlarged, symmetric, no tenderness/mass/nodules Back: symmetric, no curvature. ROM normal. No CVA tenderness. Lungs: clear to auscultation bilaterally Heart: regular rate and rhythm, S1, S2 normal, no murmur, click, rub or gallop Abdomen: soft, non-tender; bowel sounds normal; slight bulge to left lower quadrant lateral to surgical scar.  organomegaly Pulses: 2+ and symmetric Skin: Skin color, texture, turgor normal. No rashes or lesions Lymph nodes: Cervical, supraclavicular, and axillary nodes normal. Neuro:  awake and interactive with normal mood and affect. Higher cortical functions are normal. Speech is clear without word-finding difficulty or dysarthria. Extraocular movements are intact. Visual fields of both eyes are grossly intact. Sensation to  light touch is grossly intact bilaterally of upper and lower extremities. Motor examination shows 4+/5 symmetric hand grip and upper extremity and 5/5 lower extremity strength. There is no pronation or drift. Gait is non-ataxic   Lab Results  Component Value Date   HGBA1C 5.5 07/04/2022   HGBA1C 5.6 12/28/2021   HGBA1C 5.4 12/15/2020    Lab Results  Component Value Date   CREATININE 0.92 07/04/2022   CREATININE 1.11 (H) 06/25/2022   CREATININE 0.83 12/28/2021    Lab Results  Component Value Date   WBC 4.9 07/04/2022   HGB 11.4 (L) 07/04/2022   HCT 33.0 (L) 07/04/2022   PLT 175.0 07/04/2022   GLUCOSE 87 07/04/2022   CHOL 146 07/04/2022   TRIG 106.0 07/04/2022   HDL 53.60 07/04/2022   LDLCALC 72 07/04/2022   ALT 19 07/04/2022   AST 21 07/04/2022   NA 139 07/04/2022   K 4.3 07/04/2022   CL 104  07/04/2022   CREATININE 0.92 07/04/2022   BUN 21 07/04/2022   CO2 26 07/04/2022   TSH 2.86 07/04/2022   HGBA1C 5.5 07/04/2022    DG Shoulder Left Port  Result Date: 06/25/2022 CLINICAL DATA:  [id number]; fall EXAM: LEFT SHOULDER; LEFT HUMERUS - 2+ VIEW COMPARISON:  None Available. FINDINGS: Acute comminuted and displaced proximal humeral fracture involving the greater tuberosity and neck. No acute displaced fracture of the humeral shaft or distal humerus. No shoulder dislocation. The elbow is grossly unremarkable. There is no evidence of arthropathy or other focal bone abnormality. Soft tissues are unremarkable. IMPRESSION: 1. Acute comminuted and displaced proximal humeral fracture involving the greater tuberosity and neck. 2. No other acute displaced fracture of the bones of the left shoulder and humerus. Electronically Signed   By: Iven Finn M.D.   On: 06/25/2022 21:56   DG Humerus Left  Result Date: 06/25/2022 CLINICAL DATA:  [id number]; fall EXAM: LEFT SHOULDER; LEFT HUMERUS - 2+ VIEW COMPARISON:  None Available. FINDINGS: Acute comminuted and displaced proximal humeral fracture  involving the greater tuberosity and neck. No acute displaced fracture of the humeral shaft or distal humerus. No shoulder dislocation. The elbow is grossly unremarkable. There is no evidence of arthropathy or other focal bone abnormality. Soft tissues are unremarkable. IMPRESSION: 1. Acute comminuted and displaced proximal humeral fracture involving the greater tuberosity and neck. 2. No other acute displaced fracture of the bones of the left shoulder and humerus. Electronically Signed   By: Iven Finn M.D.   On: 06/25/2022 21:56    Assessment & Plan:   Problem List Items Addressed This Visit     Insomnia due to anxiety and fear    She uses 1/2 tqablet of lorazepam  no more than once or  twice weekly . The risks and benefits of  Chronic  benzodiazepine use were reviewed with patient today including increased risk of sedation if combined with alcohol.        Osteoporosis - Primary      Prior trials of alendronate  Were  not tolerated due to pill esophagitis ,  evista caused headaches /migraines.  She is tolerating Prolia and has suspended temporarily due to oral surgery and will resume in November.  Last DEXA was in 2021 at start of therapy and will be repeated       Relevant Orders   DG Bone Density   Vitamin D deficiency   Relevant Orders   VITAMIN D 25 Hydroxy (Vit-D Deficiency, Fractures) (Completed)   Atherosclerosis of aortic bifurcation and common iliac arteries ([facility])    TOLERATING CRESTOR TWICE WEEKLY       Anemia     New onset,  Etiology unclear . Screening begins.  Taking oral b 12      Relevant Orders   B12 and Folate Panel (Completed)   IBC + Ferritin (Completed)   Fecal occult blood, imunochemical   Protein electrophoresis, serum   IFE AND PE, RANDOM URINE   Frequent falls    Her falls were mechanical but likely facilitated by proximal muscle weakness .  Recommending PT to improve proximal muscle strength       Closed comminuted left humeral fracture     Secondary to mechanical fall.  Managed by [facility] with sling and PT.  Checking vitamin D.  Calcium supplementation advised       I spent a total of 45  minutes with this patient in a face to face visit on the date of  this encounter reviewing the last office visit with me , last DEXA, most recent visit with General Surgery and orthopedics.  patient's diet and exercise habits, , recent ER visit including labs and imaging studies ,   and post visit ordering of testing and therapeutics.    Follow-up: Return in about 6 months (around 01/05/2023).   Crecencio Mc, MD

## 2022-07-07 NOTE — Assessment & Plan Note (Signed)
She uses 1/2 tqablet of lorazepam  no more than once or  twice weekly . The risks and benefits of  Chronic  benzodiazepine use were reviewed with patient today including increased risk of sedation if combined with alcohol.

## 2022-07-07 NOTE — Assessment & Plan Note (Signed)
Secondary to mechanical fall.  Managed by [facility] with sling and PT.  Checking vitamin D.  Calcium supplementation advised

## 2022-07-07 NOTE — Assessment & Plan Note (Addendum)
Her falls were mechanical but likely facilitated by proximal muscle weakness .  Recommending PT to improve proximal muscle strength

## 2022-07-07 NOTE — Patient Instructions (Addendum)
FOR YOUR LEFT ARM PAIN , IN ORDER FOR YOU TO HANDLE THE PAIN OF PHYSICAL THERAPY:  You can add up to 2000 mg of acetominophen (tylenol) every day safely  In divided doses (500 mg every 6 hours  Or 1000 mg every 12 hours.) LONG TERM.  SHORT TERM YOU CAN USE 4000 MG IN DIVIDED DOSES (OVER A 24 HR PERIOD) FOR UP TO ONE WEEK   Anemia workup starts today    Take at least  one calcium supplement daily,  goal is 1800 mg .  Vit d dose TBD  I agree with PT to strengthen core and proximal muscles   Prolia to start in November   DEXA scan due this fall,  and   DEXA scan  been ordered.  You are encouraged (required) to call to schedule your own  appointment at [facility]  , and their phone number is [phone]

## 2022-07-07 NOTE — Assessment & Plan Note (Signed)
TOLERATING CRESTOR TWICE WEEKLY

## 2022-07-07 NOTE — Assessment & Plan Note (Signed)
New onset,  Etiology unclear . Screening begins.  Taking oral b 12

## 2022-07-07 NOTE — Assessment & Plan Note (Signed)
Prior trials of alendronate  Were  not tolerated due to pill esophagitis ,  evista caused headaches /migraines.  She is tolerating Prolia and has suspended temporarily due to oral surgery and will resume in November.  Last DEXA was in 2021 at start of therapy and will be repeated

## 2022-07-11 ENCOUNTER — Telehealth: Payer: Self-pay | Admitting: *Deleted

## 2022-07-11 ENCOUNTER — Encounter: Payer: Self-pay | Admitting: Internal Medicine

## 2022-07-11 DIAGNOSIS — R195 Other fecal abnormalities: Secondary | ICD-10-CM | POA: Insufficient documentation

## 2022-07-11 LAB — FECAL OCCULT BLOOD, IMMUNOCHEMICAL: Fecal Occult Bld: POSITIVE — AB

## 2022-07-11 NOTE — Telephone Encounter (Signed)
Patient returned office phone call and note was read. No preference on doctor.

## 2022-07-11 NOTE — Telephone Encounter (Signed)
CRITICAL VALUE STICKER  CRITICAL VALUE: +IFOB  RECEIVER (on-site recipient of call): Jari Favre, CMA  DATE & TIME NOTIFIED: 07/11/22 @ 8:50am  MESSENGER (representative from lab): Holly  MD NOTIFIED: Dr. Derrel Nip  TIME OF NOTIFICATION: 9:11am  RESPONSE:

## 2022-07-11 NOTE — Telephone Encounter (Signed)
Pt does not have a preference on GI doctor.

## 2022-07-11 NOTE — Assessment & Plan Note (Signed)
RESULTED DURING WORKUP FOR ANEMIA. NO PRIOR COLON CA SCREENING

## 2022-07-11 NOTE — Telephone Encounter (Signed)
LMTCB

## 2022-07-12 LAB — IFE AND PE, RANDOM URINE
% BETA, Urine: 0 %
ALBUMIN, U: 100 %
ALPHA 1 URINE: 0 %
ALPHA-2-GLOBULIN, U: 0 %
GAMMA GLOBULIN URINE: 0 %
Protein, Ur: 5 mg/dL

## 2022-07-12 LAB — PROTEIN ELECTROPHORESIS, SERUM
Albumin ELP: 3.6 g/dL — ABNORMAL LOW (ref 3.8–4.8)
Alpha 1: 0.4 g/dL — ABNORMAL HIGH (ref 0.2–0.3)
Alpha 2: 0.8 g/dL (ref 0.5–0.9)
Beta 2: 0.3 g/dL (ref 0.2–0.5)
Beta Globulin: 0.4 g/dL (ref 0.4–0.6)
Gamma Globulin: 0.8 g/dL (ref 0.8–1.7)
Total Protein: 6.2 g/dL (ref 6.1–8.1)

## 2022-07-18 ENCOUNTER — Telehealth: Payer: Self-pay

## 2022-07-18 NOTE — Telephone Encounter (Signed)
Patient has requested to schedule her colonoscopy without office visit.  Explained to patient that screening colonoscopy age cut off is 61 due to increase risk of complications associated with anesthesia this is why we have scheduled an office visit as opposed to moving forward with scheduling colonoscopy.  The physician will need to discuss risk and benefits prior to performing colonoscopy.  Unfortunately Dr. Marius Ditch does not have anything available until Feb.  I've advised patient if she develops any new GI symptoms to let us know.  She has been placed on the waiting list.  She stated that she had hernia repair in January, and had a fall.  Informed her if we have any cancellations we will let her know.  Thanks,  [place], [place]

## 2022-07-19 ENCOUNTER — Telehealth: Payer: Self-pay | Admitting: *Deleted

## 2022-07-19 ENCOUNTER — Encounter: Payer: Self-pay | Admitting: *Deleted

## 2022-07-19 NOTE — Telephone Encounter (Signed)
Pt due to start Prolia in November.  $0 due Auth on file (eff: 02/14/22-02/15/23)  Left voicemail for pt to return call to scheduled & sent mychart message

## 2022-07-22 NOTE — Telephone Encounter (Signed)
Appt scheduled on 09/06/22

## 2022-07-23 ENCOUNTER — Other Ambulatory Visit: Payer: Self-pay | Admitting: Internal Medicine

## 2022-09-06 ENCOUNTER — Ambulatory Visit (INDEPENDENT_AMBULATORY_CARE_PROVIDER_SITE_OTHER): Payer: Medicare HMO

## 2022-09-06 DIAGNOSIS — M816 Localized osteoporosis [Lequesne]: Secondary | ICD-10-CM | POA: Diagnosis not present

## 2022-09-06 NOTE — Progress Notes (Signed)
Pt presented for her Prolia injection. Pt was identified through two identifiers. Pt tolerated the Subq injection well in the right arm.

## 2022-09-12 ENCOUNTER — Other Ambulatory Visit: Payer: Self-pay | Admitting: Internal Medicine

## 2022-09-12 ENCOUNTER — Ambulatory Visit
Admission: RE | Admit: 2022-09-12 | Discharge: 2022-09-12 | Disposition: A | Discharge status: Home / Self Care | Payer: Medicare HMO | Source: Ambulatory Visit | Attending: Internal Medicine | Admitting: Internal Medicine

## 2022-09-12 DIAGNOSIS — M8000XS Age-related osteoporosis with current pathological fracture, unspecified site, sequela: Secondary | ICD-10-CM | POA: Insufficient documentation

## 2022-09-12 DIAGNOSIS — D649 Anemia, unspecified: Secondary | ICD-10-CM

## 2022-09-12 DIAGNOSIS — E559 Vitamin D deficiency, unspecified: Secondary | ICD-10-CM

## 2022-09-12 DIAGNOSIS — Z780 Asymptomatic menopausal state: Secondary | ICD-10-CM | POA: Insufficient documentation

## 2022-09-12 DIAGNOSIS — R296 Repeated falls: Secondary | ICD-10-CM

## 2022-09-12 DIAGNOSIS — Z13820 Encounter for screening for osteoporosis: Secondary | ICD-10-CM | POA: Insufficient documentation

## 2022-09-12 DIAGNOSIS — I700 Atherosclerosis of aorta: Secondary | ICD-10-CM

## 2022-09-12 DIAGNOSIS — F409 Phobic anxiety disorder, unspecified: Secondary | ICD-10-CM

## 2022-09-12 DIAGNOSIS — S42355S Nondisplaced comminuted fracture of shaft of humerus, left arm, sequela: Secondary | ICD-10-CM

## 2022-09-12 DIAGNOSIS — F5105 Insomnia due to other mental disorder: Secondary | ICD-10-CM

## 2022-09-12 DIAGNOSIS — M816 Localized osteoporosis [Lequesne]: Secondary | ICD-10-CM

## 2022-09-21 NOTE — Telephone Encounter (Signed)
Error

## 2022-09-24 ENCOUNTER — Encounter: Payer: Self-pay | Admitting: Internal Medicine

## 2022-09-24 DIAGNOSIS — M816 Localized osteoporosis [Lequesne]: Secondary | ICD-10-CM

## 2022-09-28 NOTE — Assessment & Plan Note (Signed)
Prior trials of alendronate  Were  not tolerated due to pill esophagitis ,  evista caused headaches /migraines.  She is tolerating Prolia and has suspended temporarily due to oral surgery and will resume in November.  Last DEXA was in 2021 at start of therapy and T scores have worsened by repeat evaluation in 2023.  Endocrinology referral offered and accepted

## 2022-09-29 ENCOUNTER — Other Ambulatory Visit: Payer: Self-pay | Admitting: Internal Medicine

## 2022-10-21 ENCOUNTER — Other Ambulatory Visit: Payer: Self-pay | Admitting: Internal Medicine

## 2022-11-05 ENCOUNTER — Other Ambulatory Visit: Payer: Self-pay | Admitting: Internal Medicine

## 2022-11-10 ENCOUNTER — Other Ambulatory Visit: Payer: Self-pay

## 2022-11-15 ENCOUNTER — Encounter: Payer: Self-pay | Admitting: Gastroenterology

## 2022-11-15 ENCOUNTER — Ambulatory Visit (INDEPENDENT_AMBULATORY_CARE_PROVIDER_SITE_OTHER): Payer: Medicare HMO | Admitting: Gastroenterology

## 2022-11-15 VITALS — BP 121/75 | HR 60 | Temp 99.0°F | Ht 63.0 in | Wt 161.2 lb

## 2022-11-15 DIAGNOSIS — R195 Other fecal abnormalities: Secondary | ICD-10-CM

## 2022-11-15 MED ORDER — NA SULFATE-K SULFATE-MG SULF 17.5-3.13-1.6 GM/177ML PO SOLN: 1.0000 | Freq: Once | ORAL | 0 refills | Status: AC

## 2022-11-15 NOTE — Progress Notes (Unsigned)
Cephas Darby, MD [address]  [place]  [place], [place] [address]  Main: [phone]  Fax: [phone]    Gastroenterology Consultation  Referring Provider:     Crecencio Mc, MD Primary Care Physician:  Crecencio Mc, MD Primary Gastroenterologist:  Dr. Cephas Darby Reason for Consultation: Fecal occult blood is positive        HPI:   Michelle Butler is a 82 y.o. female referred by Dr. Crecencio Mc, MD  for consultation & management of FOBT positive.  Patient had routine physical, was found to have new diagnosis of anemia, hemoglobin was 11.4 on 07/04/2022.  Further workup revealed normal iron panel, B12 and folate levels.  Normal TSH, serum protein electrophoresis was normal, underwent fecal occult blood testing as part of anemia workup, came back positive.  Therefore, patient is referred to GI for further evaluation.  Apparently, patient denies any GI symptoms including change in bowel habits or change in stool caliber, abdominal pain, loss of appetite or weight loss.  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   GERD (gastroesophageal reflux disease)    History of methicillin resistant staphylococcus aureus (MRSA) 2012   Hyperlipidemia    Hypothyroidism     Past Surgical History:  Procedure Laterality Date   APPENDECTOMY  1959   COLONOSCOPY     HERNIA REPAIR  2013   Right side with mesh   INGUINAL HERNIA REPAIR Left 10/15/2021   Procedure: HERNIA REPAIR INGUINAL ADULT;  Surgeon: Robert Bellow, MD;  Location: [facility] ORS;  Service: General;  Laterality: Left;   INSERTION OF MESH  10/15/2021   Procedure: INSERTION OF MESH;  Surgeon: Robert Bellow, MD;  Location: [facility] ORS;   Service: General;;   left hip replacement  2011   right hip replacement  [place]     Current Outpatient Medications:    acetaminophen (TYLENOL) 500 MG tablet, Take 500 mg by mouth every 6 (six) hours as needed for moderate pain., Disp: , Rfl:    atorvastatin (LIPITOR) 20 MG tablet, TAKE ONE TABLET BY MOUTH TWO TIMES WEEKLY, Disp: 24 tablet, Rfl: 3   calcium carbonate (TUMS - DOSED IN MG ELEMENTAL CALCIUM) 500 MG chewable tablet, Chew 1 tablet by mouth as needed for indigestion or heartburn., Disp: , Rfl:    Carboxymeth-Glyc-Polysorb PF (REFRESH OPTIVE MEGA-3) 0.5-1-0.5 % SOLN, Place 1 drop into both eyes in the morning., Disp: , Rfl:    Carboxymethylcellulose Sodium (REFRESH LIQUIGEL OP), Place 1 drop into both eyes at bedtime., Disp: , Rfl:    cholecalciferol (VITAMIN D3) 25 MCG (1000 UNIT) tablet, Take 1,000 Units by mouth in the morning and at bedtime., Disp: , Rfl:    denosumab (PROLIA) 60 MG/ML SOSY injection, Inject 60 mg into the skin every 6 (six) months., Disp: , Rfl:    dexamethasone (DECADRON) 4 MG tablet, Take 1 tablet by mouth 3 (three) times daily., Disp: , Rfl:    levothyroxine (SYNTHROID) 100 MCG tablet, TAKE 1 TABLET BY MOUTH EVERY DAY, Disp: 90 tablet, Rfl: 1   LORazepam (ATIVAN) 1 MG tablet, TAKE 1 TABLET  BY MOUTH AT BEDTIME AS NEEDED FOR ANXIETY, Disp: 30 tablet, Rfl: 2   metroNIDAZOLE (METROCREAM) 0.75 % cream, Apply 1 application topically daily as needed (rosacea)., Disp: , Rfl:    naproxen sodium (ALEVE) 220 MG tablet, Take 220 mg by mouth daily as needed (pain)., Disp: , Rfl:    Psyllium (METAMUCIL) 48.57 % POWD, Take 1 Scoop by mouth daily., Disp: , Rfl:    sertraline (ZOLOFT) 50 MG tablet, TAKE 1 AND 1/2 TABLETS BY MOUTH DAILY, Disp: 135 tablet, Rfl: 1   vitamin B-12 (CYANOCOBALAMIN) 1000 MCG tablet, Take 1,000 mcg by mouth daily., Disp: , Rfl:   Current Facility-Administered Medications:     denosumab (PROLIA) injection 60 mg, 60 mg, Subcutaneous, Q6 months, Crecencio Mc, MD, 60 mg at 09/06/22 [id number]   Family History  Problem Relation Age of Onset   Peripheral Artery Disease Mother 68   Hip fracture Father 44   Melanoma Brother 66   Stroke Maternal Grandfather 39   Stroke Paternal Grandmother 83   Pneumonia Paternal Grandfather 10   Melanoma Brother    Breast cancer Other 76       [place]     Social History   Tobacco Use   Smoking status: Never   Smokeless tobacco: Never  Vaping Use   Vaping Use: Never used  Substance Use Topics   Alcohol use: Yes    Alcohol/week: 2.0 standard drinks of alcohol    Types: 2 Glasses of wine per week    Comment: wine rare   Drug use: Never    Allergies as of 11/15/2022 - Review Complete 11/15/2022  Allergen Reaction Noted   Citalopram Nausea Only 05/09/2011   Duloxetine Nausea Only 09/25/2015   Shellfish allergy Nausea Only 10/08/2021   Amoxicillin Rash 01/17/2022    Review of Systems:    All systems reviewed and negative except where noted in HPI.   Physical Exam:  BP 121/75 (BP Location: Right Arm, Patient Position: Sitting, Cuff Size: Normal)   Pulse 60   Temp 99 F (37.2 C)   Ht 5' 3"$  (1.6 m)   Wt 161 lb 3.2 oz (73.1 kg)   BMI 28.56 kg/m  No LMP recorded. Patient is postmenopausal.  General:   Alert,  Well-developed, well-nourished, pleasant and cooperative in NAD Head:  Normocephalic and atraumatic. Eyes:  Sclera clear, no icterus.   Conjunctiva pink. Ears:  Normal auditory acuity. Nose:  No deformity, discharge, or lesions. Mouth:  No deformity or lesions,oropharynx pink & moist. Neck:  Supple; no masses or thyromegaly. Lungs:  Respirations even and unlabored.  Clear throughout to auscultation.   No wheezes, crackles, or rhonchi. No acute distress. Heart:  Regular rate and rhythm; no murmurs, clicks, rubs, or gallops. Abdomen:  Normal bowel sounds. Soft, non-tender and non-distended without masses,  hepatosplenomegaly or hernias noted.  No guarding or rebound tenderness.   Rectal: Not performed Msk:  Symmetrical without gross deformities. Good, equal movement & strength bilaterally. Pulses:  Normal pulses noted. Extremities:  No clubbing or edema.  No cyanosis. Neurologic:  Alert and oriented x3;  grossly normal neurologically. Skin:  Intact without significant lesions or rashes. No jaundice. Psych:  Alert and cooperative. Normal mood and affect.  Imaging Studies: No abdominal imaging  Assessment and Plan:   Michelle Butler is a 82 y.o. female with history of hypertension, hypothyroidism, left inguinal hernia repair in 10/2021 is seen in consultation for new onset of normocytic anemia, FOBT positive  Patient is healthy, with no  major medical comorbidities, independent of ADLs, functionally independent Discussed about colonoscopy, risks and benefits, rule out any underlying malignancy and she is agreeable to undergo  I have discussed alternative options, risks & benefits,  which include, but are not limited to, bleeding, infection, perforation,respiratory complication & drug reaction.  The patient agrees with this plan & written consent will be obtained.     Follow up as needed   Cephas Darby, MD

## 2022-11-29 ENCOUNTER — Ambulatory Visit: Payer: Medicare HMO | Admitting: Anesthesiology

## 2022-11-29 ENCOUNTER — Ambulatory Visit
Admission: RE | Admit: 2022-11-29 | Discharge: 2022-11-29 | Disposition: A | Discharge status: Home / Self Care | Payer: Medicare HMO | Source: Ambulatory Visit | Attending: Gastroenterology | Admitting: Gastroenterology

## 2022-11-29 ENCOUNTER — Other Ambulatory Visit: Payer: Self-pay

## 2022-11-29 ENCOUNTER — Encounter: Payer: Self-pay | Admitting: Gastroenterology

## 2022-11-29 ENCOUNTER — Encounter
Admission: RE | Disposition: A | Discharge status: Home / Self Care | Payer: Self-pay | Source: Ambulatory Visit | Attending: Gastroenterology

## 2022-11-29 DIAGNOSIS — F419 Anxiety disorder, unspecified: Secondary | ICD-10-CM | POA: Diagnosis not present

## 2022-11-29 DIAGNOSIS — K219 Gastro-esophageal reflux disease without esophagitis: Secondary | ICD-10-CM | POA: Insufficient documentation

## 2022-11-29 DIAGNOSIS — K635 Polyp of colon: Secondary | ICD-10-CM | POA: Diagnosis not present

## 2022-11-29 DIAGNOSIS — D123 Benign neoplasm of transverse colon: Secondary | ICD-10-CM | POA: Insufficient documentation

## 2022-11-29 DIAGNOSIS — R195 Other fecal abnormalities: Secondary | ICD-10-CM | POA: Insufficient documentation

## 2022-11-29 DIAGNOSIS — K644 Residual hemorrhoidal skin tags: Secondary | ICD-10-CM | POA: Insufficient documentation

## 2022-11-29 DIAGNOSIS — D125 Benign neoplasm of sigmoid colon: Secondary | ICD-10-CM | POA: Diagnosis not present

## 2022-11-29 DIAGNOSIS — K5730 Diverticulosis of large intestine without perforation or abscess without bleeding: Secondary | ICD-10-CM | POA: Insufficient documentation

## 2022-11-29 DIAGNOSIS — E039 Hypothyroidism, unspecified: Secondary | ICD-10-CM | POA: Insufficient documentation

## 2022-11-29 DIAGNOSIS — Z1211 Encounter for screening for malignant neoplasm of colon: Secondary | ICD-10-CM | POA: Insufficient documentation

## 2022-11-29 HISTORY — PX: COLONOSCOPY WITH PROPOFOL: SHX5780

## 2022-11-29 SURGERY — COLONOSCOPY WITH PROPOFOL
Anesthesia: General

## 2022-11-29 MED ORDER — LIDOCAINE HCL (CARDIAC) PF 100 MG/5ML IV SOSY
PREFILLED_SYRINGE | INTRAVENOUS | Status: DC | PRN
  Administered 2022-11-29: 50 mg via INTRAVENOUS

## 2022-11-29 MED ORDER — PROPOFOL 500 MG/50ML IV EMUL
INTRAVENOUS | Status: DC | PRN
  Administered 2022-11-29: 150 ug/kg/min via INTRAVENOUS

## 2022-11-29 MED ORDER — PROPOFOL 10 MG/ML IV BOLUS
INTRAVENOUS | Status: DC | PRN
  Administered 2022-11-29: 50 mg via INTRAVENOUS

## 2022-11-29 MED ORDER — SODIUM CHLORIDE 0.9 % IV SOLN: INTRAVENOUS | Status: DC

## 2022-11-29 NOTE — H&P (Signed)
Cephas Darby, MD [address]  [place]  [place], [place] [address]  Main: [phone]  Fax: [phone] Pager: [phone]  Primary Care Physician:  Crecencio Mc, MD Primary Gastroenterologist:  Dr. Cephas Darby  Pre-Procedure History & Physical: HPI:  Michelle Butler is a 82 y.o. female is here for an colonoscopy.   Past Medical History:  Diagnosis Date   Anxiety    Cancer ([place])    basal cell nose   GERD (gastroesophageal reflux disease)    History of methicillin resistant staphylococcus aureus (MRSA) 2012   Hyperlipidemia    Hypothyroidism     Past Surgical History:  Procedure Laterality Date   APPENDECTOMY  1959   COLONOSCOPY     HERNIA REPAIR  2013   Right side with mesh   INGUINAL HERNIA REPAIR Left 10/15/2021   Procedure: HERNIA REPAIR INGUINAL ADULT;  Surgeon: Robert Bellow, MD;  Location: [facility] ORS;  Service: General;  Laterality: Left;   INSERTION OF MESH  10/15/2021   Procedure: INSERTION OF MESH;  Surgeon: Robert Bellow, MD;  Location: [facility] ORS;  Service: General;;   left hip replacement  2011   right hip replacement  [place]    Prior to Admission medications   Medication Sig Start Date End Date Taking? Authorizing Provider  cholecalciferol (VITAMIN D3) 25 MCG (1000 UNIT) tablet Take 1,000 Units by mouth in the morning and at bedtime.   Yes [provider]  levothyroxine (SYNTHROID) 100 MCG tablet TAKE 1 TABLET BY MOUTH EVERY DAY 10/24/22  Yes Crecencio Mc, MD  naproxen sodium (ALEVE) 220 MG tablet Take 220 mg by mouth daily as needed (pain).   Yes [provider]  sertraline (ZOLOFT) 50 MG tablet TAKE 1 AND 1/2 TABLETS BY MOUTH DAILY 07/26/22  Yes Crecencio Mc, MD  vitamin B-12 (CYANOCOBALAMIN) 1000 MCG tablet Take 1,000 mcg by mouth daily.   Yes [provider]  acetaminophen (TYLENOL) 500 MG tablet Take 500 mg by mouth every 6  (six) hours as needed for moderate pain.    [provider]  atorvastatin (LIPITOR) 20 MG tablet TAKE ONE TABLET BY MOUTH TWO TIMES WEEKLY 11/07/22   Crecencio Mc, MD  calcium carbonate (TUMS - DOSED IN MG ELEMENTAL CALCIUM) 500 MG chewable tablet Chew 1 tablet by mouth as needed for indigestion or heartburn.    [provider]  Carboxymeth-Glyc-Polysorb PF (REFRESH OPTIVE MEGA-3) 0.5-1-0.5 % SOLN Place 1 drop into both eyes in the morning.    [provider]  Carboxymethylcellulose Sodium (REFRESH LIQUIGEL OP) Place 1 drop into both eyes at bedtime.    [provider]  denosumab (PROLIA) 60 MG/ML SOSY injection Inject 60 mg into the skin every 6 (six) months.    [provider]  dexamethasone (DECADRON) 4 MG tablet Take 1 tablet by mouth 3 (three) times daily. Patient not taking: Reported on 11/29/2022    [provider]  LORazepam (ATIVAN) 1 MG tablet TAKE 1 TABLET BY MOUTH AT BEDTIME AS NEEDED FOR ANXIETY 09/28/21   Crecencio Mc, MD  metroNIDAZOLE (METROCREAM) 0.75 % cream Apply 1 application topically daily as needed (rosacea).    [provider]  Psyllium (METAMUCIL) 48.57 % POWD Take 1 Scoop by mouth daily.    [provider]    Allergies as of 11/15/2022 - Review Complete 11/15/2022  Allergen Reaction  Noted   Citalopram Nausea Only 05/09/2011   Duloxetine Nausea Only 09/25/2015   Shellfish allergy Nausea Only 10/08/2021   Amoxicillin Rash 01/17/2022    Family History  Problem Relation Age of Onset   Peripheral Artery Disease Mother 39   Hip fracture Father 66   Melanoma Brother 80   Stroke Maternal Grandfather 82   Stroke Paternal Grandmother 105   Pneumonia Paternal Grandfather 89   Melanoma Brother    Breast cancer Other 65       [place]    Social History   Socioeconomic History   Marital status: Married    Spouse name: Not on file   Number of children: Not on file   Years of education: Not on  file   Highest education level: Not on file  Occupational History   Not on file  Tobacco Use   Smoking status: Never   Smokeless tobacco: Never  Vaping Use   Vaping Use: Never used  Substance and Sexual Activity   Alcohol use: Yes    Alcohol/week: 2.0 standard drinks of alcohol    Types: 2 Glasses of wine per week    Comment: wine rare   Drug use: Never   Sexual activity: Not on file  Other Topics Concern   Not on file  Social History Narrative   Married    [place]   Social Determinants of Health   Financial Resource Strain: Low Risk  (01/03/2022)   Overall Financial Resource Strain (CARDIA)    Difficulty of Paying Living Expenses: Not hard at all  Food Insecurity: No Food Insecurity (01/03/2022)   Hunger Vital Sign    Worried About Running Out of Food in the Last Year: Never true    Ran Out of Food in the Last Year: Never true  Transportation Needs: No Transportation Needs (01/03/2022)   PRAPARE - Hydrologist (Medical): No    Lack of Transportation (Non-Medical): No  Physical Activity: Sufficiently Active (01/03/2022)   Exercise Vital Sign    Days of Exercise per Week: 5 days    Minutes of Exercise per Session: 60 min  Stress: No Stress Concern Present (01/03/2022)   [place]    Feeling of Stress : Not at all  Social Connections: Unknown (01/03/2022)   Social Connection and Isolation Panel [NHANES]    Frequency of Communication with Friends and Family: Not on file    Frequency of Social Gatherings with Friends and Family: Not on file    Attends Religious Services: Not on file    Active Member of Clubs or Organizations: Not on file    Attends Archivist Meetings: Not on file    Marital Status: Married  Intimate Partner Violence: Not At Risk (01/03/2022)   Humiliation, Afraid, Rape, and Kick questionnaire    Fear of Current or Ex-Partner: No    Emotionally  Abused: No    Physically Abused: No    Sexually Abused: No    Review of Systems: See HPI, otherwise negative ROS  Physical Exam: BP 123/65   Pulse 74   Temp (!) 97 F (36.1 C) (Temporal)   Resp 16   Ht 5' 3"$  (1.6 m)   Wt 69.4 kg   SpO2 98%   BMI 27.10 kg/m  General:   Alert,  pleasant and cooperative in NAD Head:  Normocephalic and atraumatic. Neck:  Supple; no masses or thyromegaly. Lungs:  Clear throughout  to auscultation.    Heart:  Regular rate and rhythm. Abdomen:  Soft, nontender and nondistended. Normal bowel sounds, without guarding, and without rebound.   Neurologic:  Alert and  oriented x4;  grossly normal neurologically.  Impression/Plan: Maysie Spinola is here for an colonoscopy to be performed for  Fecal occult blood is positive   Risks, benefits, limitations, and alternatives regarding  colonoscopy have been reviewed with the patient.  Questions have been answered.  All parties agreeable.   Sherri Sear, MD  11/29/2022, 9:49 AM

## 2022-11-29 NOTE — Anesthesia Postprocedure Evaluation (Signed)
Anesthesia Post Note  Patient: Michelle Butler  Procedure(s) Performed: COLONOSCOPY WITH PROPOFOL  Patient location during evaluation: Endoscopy Anesthesia Type: General Level of consciousness: awake and alert Pain management: pain level controlled Vital Signs Assessment: post-procedure vital signs reviewed and stable Respiratory status: spontaneous breathing, nonlabored ventilation, respiratory function stable and patient connected to nasal cannula oxygen Cardiovascular status: blood pressure returned to baseline and stable Postop Assessment: no apparent nausea or vomiting Anesthetic complications: no   No notable events documented.   Last Vitals:  Vitals:   11/29/22 1025 11/29/22 1035  BP: 117/69 131/69  Pulse: 67 72  Resp: 15 17  Temp: (!) 36.1 C   SpO2: 97% 100%    Last Pain:  Vitals:   11/29/22 1035  TempSrc:   PainSc: 0-No pain                 Arita Miss

## 2022-11-29 NOTE — Op Note (Signed)
[facility] Patient Name: Michelle Butler Procedure Date: 11/29/2022 9:46 AM MRN: [id number] Account #: [id number] Date of Birth: [birth year] Admit Type: Outpatient Age: 82 Room: [facility] ENDO ROOM 3 Gender: Female Note Status: Finalized Instrument Name: Peds Colonoscope [id number] Procedure:             Colonoscopy Indications:           Positive fecal immunochemical test Providers:             Lin Landsman MD, MD Medicines:             General Anesthesia Complications:         No immediate complications. Estimated blood loss: None. Procedure:             Pre-Anesthesia Assessment:                        - Prior to the procedure, a History and Physical was                         performed, and patient medications and allergies were                         reviewed. The patient is competent. The risks and                         benefits of the procedure and the sedation options and                         risks were discussed with the patient. All questions                         were answered and informed consent was obtained.                         Patient identification and proposed procedure were                         verified by the physician, the nurse, the                         anesthesiologist, the anesthetist and the technician                         in the pre-procedure area in the procedure room in the                         endoscopy suite. Mental Status Examination: alert and                         oriented. Airway Examination: normal oropharyngeal                         airway and neck mobility. Respiratory Examination:                         clear to auscultation. CV Examination: normal.                         Prophylactic Antibiotics: The  patient does not require                         prophylactic antibiotics. Prior Anticoagulants: The                         patient has taken no anticoagulant or antiplatelet                          agents. ASA Grade Assessment: III - A patient with                         severe systemic disease. After reviewing the risks and                         benefits, the patient was deemed in satisfactory                         condition to undergo the procedure. The anesthesia                         plan was to use general anesthesia. Immediately prior                         to administration of medications, the patient was                         re-assessed for adequacy to receive sedatives. The                         heart rate, respiratory rate, oxygen saturations,                         blood pressure, adequacy of pulmonary ventilation, and                         response to care were monitored throughout the                         procedure. The physical status of the patient was                         re-assessed after the procedure.                        After obtaining informed consent, the colonoscope was                         passed under direct vision. Throughout the procedure,                         the patient's blood pressure, pulse, and oxygen                         saturations were monitored continuously. The                         Colonoscope was introduced through the anus and  advanced to the the cecum, identified by appendiceal                         orifice and ileocecal valve. The colonoscopy was                         performed without difficulty. The patient tolerated                         the procedure well. The quality of the bowel                         preparation was evaluated using the BBPS [facility] Bowel                         Preparation Scale) with scores of: Right Colon = 3,                         Transverse Colon = 3 and Left Colon = 3 (entire mucosa                         seen well with no residual staining, small fragments                         of stool or opaque liquid). The total BBPS score                          equals 9. The ileocecal valve, appendiceal orifice,                         and rectum were photographed. Findings:      The perianal and digital rectal examinations were normal. Pertinent       negatives include normal sphincter tone and no palpable rectal lesions.      Two sessile polyps were found in the sigmoid colon and transverse colon.       The polyps were 3 to 5 mm in size. These polyps were removed with a cold       snare. Resection and retrieval were complete. Estimated blood loss: none.      Multiple large-mouthed diverticula were found in the recto-sigmoid colon       and sigmoid colon.      Non-bleeding external hemorrhoids were found during retroflexion. The       hemorrhoids were moderate. Impression:            - Two 3 to 5 mm polyps in the sigmoid colon and in the                         transverse colon, removed with a cold snare. Resected                         and retrieved.                        - Diverticulosis in the recto-sigmoid colon and in the                         sigmoid colon.                        -  Non-bleeding external hemorrhoids. Recommendation:        - Discharge patient to home (with escort).                        - Resume previous diet today.                        - Continue present medications.                        - Await pathology results.                        - Repeat colonoscopy in 3 - 5 years for surveillance                         based on pathology results. Procedure Code(s):     --- Professional ---                        [phone], Colonoscopy, flexible; with removal of                         tumor(s), polyp(s), or other lesion(s) by snare                         technique Diagnosis Code(s):     --- Professional ---                        D12.5, Benign neoplasm of sigmoid colon                        D12.3, Benign neoplasm of transverse colon (hepatic                         flexure or splenic flexure)                         K64.4, Residual hemorrhoidal skin tags                        R19.5, Other fecal abnormalities                        K57.30, Diverticulosis of large intestine without                         perforation or abscess without bleeding CPT copyright 2022 American Medical Association. All rights reserved. The codes documented in this report are preliminary and upon coder review may  be revised to meet current compliance requirements. Dr. Ulyess Mort Lin Landsman MD, MD 11/29/2022 10:23:47 AM This report has been signed electronically. Number of Addenda: 0 Note Initiated On: 11/29/2022 9:46 AM Scope Withdrawal Time: 0 hours 9 minutes 14 seconds  Total Procedure Duration: 0 hours 13 minutes 5 seconds  Estimated Blood Loss:  Estimated blood loss: none.      [facility]

## 2022-11-29 NOTE — Transfer of Care (Signed)
Immediate Anesthesia Transfer of Care Note  Patient: [name]  Procedure(s) Performed: COLONOSCOPY WITH PROPOFOL  Patient Location: PACU and Endoscopy Unit  Anesthesia Type:General  Level of Consciousness: drowsy and patient cooperative  Airway & Oxygen Therapy: Patient Spontanous Breathing  Post-op Assessment: Report given to RN and Post -op Vital signs reviewed and stable  Post vital signs: Reviewed and stable  Last Vitals:  Vitals Value Taken Time  BP 116/81   Temp    Pulse 67 11/29/22 1025  Resp 15 11/29/22 1025  SpO2 97 % 11/29/22 1025  Vitals shown include unvalidated device data.  Last Pain:  Vitals:   11/29/22 0945  TempSrc: Temporal  PainSc: 0-No pain         Complications: No notable events documented.

## 2022-11-29 NOTE — Anesthesia Preprocedure Evaluation (Signed)
Anesthesia Evaluation  Patient identified by MRN, date of birth, ID band Patient awake    Reviewed: Allergy & Precautions, NPO status , Patient's Chart, lab work & pertinent test results  Airway Mallampati: III  TM Distance: <3 FB Neck ROM: full    Dental  (+) Chipped   Pulmonary neg pulmonary ROS, neg shortness of breath   Pulmonary exam normal        Cardiovascular Exercise Tolerance: Good (-) angina (-) Past MI and (-) DOE negative cardio ROS Normal cardiovascular exam     Neuro/Psych  PSYCHIATRIC DISORDERS Anxiety     negative neurological ROS     GI/Hepatic Neg liver ROS,GERD  Medicated and Controlled,,  Endo/Other  Hypothyroidism    Renal/GU      Musculoskeletal   Abdominal   Peds  Hematology negative hematology ROS (+)   Anesthesia Other Findings Past Medical History: No date: Anxiety No date: Cancer [facility])     Comment:  basal cell nose No date: GERD (gastroesophageal reflux disease) 2012: History of methicillin resistant staphylococcus aureus (MRSA) No date: Hyperlipidemia No date: Hypothyroidism  Past Surgical History: 1959: APPENDECTOMY No date: COLONOSCOPY 2013: HERNIA REPAIR     Comment:  Right side with mesh 2011: 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 1959: TONSILLECTOMY 1982: TUBAL LIGATION 1991: VARICOSE VEIN SURGERY  BMI    Body Mass Index: 26.93 kg/m      Reproductive/Obstetrics negative OB ROS                              Anesthesia Physical Anesthesia Plan  ASA: 2  Anesthesia Plan: General   Post-op Pain Management: Minimal or no pain anticipated   Induction: Intravenous  PONV Risk Score and Plan: 3 and Propofol infusion, TIVA and Ondansetron  Airway Management Planned: Natural Airway  Additional Equipment: None  Intra-op Plan:   Post-operative Plan: Extubation in OR  Informed Consent: I have reviewed the patients History and  Physical, chart, labs and discussed the procedure including the risks, benefits and alternatives for the proposed anesthesia with the patient or authorized representative who has indicated his/her understanding and acceptance.     Dental Advisory Given  Plan Discussed with: Anesthesiologist, CRNA and Surgeon  Anesthesia Plan Comments: (Discussed risks of anesthesia with patient, including possibility of difficulty with spontaneous ventilation under anesthesia necessitating airway intervention, PONV, and rare risks such as cardiac or respiratory or neurological events, and allergic reactions. Discussed the role of CRNA in patient's perioperative care. Patient understands.)         Anesthesia Quick Evaluation

## 2022-11-29 NOTE — Anesthesia Procedure Notes (Signed)
Procedure Name: MAC Date/Time: 11/29/2022 10:04 AM  Performed by: Jerrye Noble, CRNAPre-anesthesia Checklist: Patient identified, Emergency Drugs available, Suction available and Patient being monitored Patient Re-evaluated:Patient Re-evaluated prior to induction Oxygen Delivery Method: Nasal cannula

## 2022-11-30 ENCOUNTER — Encounter: Payer: Self-pay | Admitting: Gastroenterology

## 2022-11-30 LAB — SURGICAL PATHOLOGY

## 2022-12-01 ENCOUNTER — Ambulatory Visit (INDEPENDENT_AMBULATORY_CARE_PROVIDER_SITE_OTHER): Payer: Medicare HMO | Admitting: Internal Medicine

## 2022-12-01 ENCOUNTER — Encounter: Payer: Self-pay | Admitting: Internal Medicine

## 2022-12-01 VITALS — BP 122/78 | HR 65 | Ht 63.0 in | Wt 158.4 lb

## 2022-12-01 DIAGNOSIS — M810 Age-related osteoporosis without current pathological fracture: Secondary | ICD-10-CM | POA: Diagnosis not present

## 2022-12-01 DIAGNOSIS — E559 Vitamin D deficiency, unspecified: Secondary | ICD-10-CM

## 2022-12-01 NOTE — Progress Notes (Signed)
Patient ID: Michelle Butler, female   DOB: 03/18/1941, 82 y.o.   MRN: [id number]  HPI  Demarcus Guzman is a 82 y.o.-year-old female, referred by her PCP, Dr. Derrel Nip, for management of osteoporosis (OP). Her daughter, Jeannett Senior, is also my pt.  Pt was dx with OP in 2021, but osteopenia ~10 years ago, before she moved to [place] from [place], [place].  I reviewed pt's DXA scans: Date L1-L4 T score FN T score 33% distal Radius Ultra distal radius  09/12/2022 ([facility]) N/a N/a -3.1 (-3.8%) -1.7  05/19/2020 ([facility]) N/a N/a -2.8 -2.7   She had no falls in last 5 mo, but fell frequently before.  No dizziness/vertigo/orthostasis/poor vision.  Fractures: - Closed comminuted L humeral fracture 06/2022 (fell when pulling on pants while standing) - in PT for 4 mo, now exercising at home and at the gym  Previous OP treatments:  - Prolia - started 08/2020 02/2021 08/2021 Skipped 02/2022 for oral surgery (as advised by the oral surgeon) 08/2022  She has a h/o vitamin D insufficiency. Reviewed available vit D levels: Lab Results  Component Value Date   VD25OH 31.91 07/07/2022   VD25OH 22.38 (L) 06/30/2021   VD25OH 32.35 12/15/2020   VD25OH 28.80 (L) 10/22/2019   Pt is on: - calcium 1000 mg a day  - vitamin D 600 units + 1000 units  + weight bearing exercises.  Also cardio dancing, line dancing, FAB (flexibility and balance).  She has a h/o B total hip replacements.  She does not take high vitamin A doses.  Menopause was at 82 y/o.   FH of osteoporosis: father - shoulder fracture, B hip fractures.  No h/o hyper/hypocalcemia or hyperparathyroidism. No h/o kidney stones. Lab Results  Component Value Date   CALCIUM 9.0 07/04/2022   CALCIUM 9.5 06/25/2022   CALCIUM 9.0 12/28/2021   CALCIUM 9.0 06/30/2021   CALCIUM 9.2 12/15/2020   CALCIUM 9.3 06/24/2020   CALCIUM 9.4 10/22/2019   No h/o thyrotoxicosis.  She does have hypothyroidism and is levothyroxine 100 mcg daily.   Reviewed TSH recent levels:  Lab Results  Component Value Date   TSH 2.86 07/04/2022   TSH 1.53 12/28/2021   TSH 2.03 06/30/2021   TSH 1.00 08/06/2020   TSH 0.37 06/24/2020   No h/o CKD. Last BUN/Cr: Lab Results  Component Value Date   BUN 21 07/04/2022   CREATININE 0.92 07/04/2022   Of note, SPEP was slightly abnormal, but UPEP + IFE did not show an M spike.  She has a history of basal cell carcinoma on the nose.  ROS: Constitutional: no weight gain, no weight loss, no fatigue, no subjective hyperthermia, + subjective hypothermia, no nocturia Eyes: no blurry vision, no xerophthalmia ENT: no sore throat, no nodules palpated in neck, no dysphagia, no odynophagia, no hoarseness, no tinnitus, + hypoacusis (wears hearing aids) Cardiovascular: no CP, no SOB, no palpitations, no leg swelling Respiratory: no cough, no SOB, no wheezing Gastrointestinal: no N, no V, no D, + C, no acid reflux Musculoskeletal: no muscle, no joint aches Skin: no rash, no hair loss, + easy bruising Neurological: no tremors, no numbness or tingling/no dizziness/no HAs Psychiatric: no depression, + anxiety  I reviewed pt's medications, allergies, PMH, social hx, family hx, and changes were documented in the history of present illness. Otherwise, unchanged from my initial visit note.  Past Medical History:  Diagnosis Date   Anxiety    Cancer ([place])    basal cell nose   GERD (gastroesophageal  reflux disease)    History of methicillin resistant staphylococcus aureus (MRSA) 2012   Hyperlipidemia    Hypothyroidism    Past Surgical History:  Procedure Laterality Date   APPENDECTOMY  1959   COLONOSCOPY     COLONOSCOPY WITH PROPOFOL N/A 11/29/2022   Procedure: COLONOSCOPY WITH PROPOFOL;  Surgeon: Lin Landsman, MD;  Location: [facility];  Service: Gastroenterology;  Laterality: N/A;   HERNIA REPAIR  2013   Right side with mesh   INGUINAL HERNIA REPAIR Left 10/15/2021   Procedure: HERNIA REPAIR  INGUINAL ADULT;  Surgeon: Robert Bellow, MD;  Location: [facility];  Service: General;  Laterality: Left;   INSERTION OF MESH  10/15/2021   Procedure: INSERTION OF MESH;  Surgeon: Robert Bellow, MD;  Location: [facility];  Service: General;;   left hip replacement  2011   right hip replacement  [place]   Social History   Socioeconomic History   Marital status: Married    Spouse name: Not on file   Number of children: 4   Years of education: Not on file   Highest education level: Not on file  Occupational History   Not on file  Tobacco Use   Smoking status: Never   Smokeless tobacco: Never  Vaping Use   Vaping Use: Never used  Substance and Sexual Activity   Alcohol use: Yes    Alcohol/week: 2.0 standard drinks of alcohol    Types: 2 Glasses of wine per week    Comment: wine rare   Drug use: Never   Sexual activity: Not on file  Other Topics Concern   Not on file  Social History Narrative   Married    [place]   Social Determinants of Health   Financial Resource Strain: Low Risk  (01/03/2022)   Overall Financial Resource Strain (CARDIA)    Difficulty of Paying Living Expenses: Not hard at all  Food Insecurity: No Food Insecurity (01/03/2022)   Hunger Vital Sign    Worried About Running Out of Food in the Last Year: Never true    Ran Out of Food in the Last Year: Never true  Transportation Needs: No Transportation Needs (01/03/2022)   PRAPARE - Hydrologist (Medical): No    Lack of Transportation (Non-Medical): No  Physical Activity: Sufficiently Active (01/03/2022)   Exercise Vital Sign    Days of Exercise per Week: 5 days    Minutes of Exercise per Session: 60 min  Stress: No Stress Concern Present (01/03/2022)   [place]    Feeling of Stress : Not at all  Social Connections: Unknown  (01/03/2022)   Social Connection and Isolation Panel [NHANES]    Frequency of Communication with Friends and Family: Not on file    Frequency of Social Gatherings with Friends and Family: Not on file    Attends Religious Services: Not on file    Active Member of Clubs or Organizations: Not on file    Attends Archivist Meetings: Not on file    Marital Status: Married  Intimate Partner Violence: Not At Risk (01/03/2022)   Humiliation, Afraid, Rape, and Kick questionnaire    Fear of Current or Ex-Partner: No    Emotionally Abused: No    Physically Abused: No    Sexually Abused: No   Current Outpatient Medications  on File Prior to Visit  Medication Sig Dispense Refill   acetaminophen (TYLENOL) 500 MG tablet Take 500 mg by mouth every 6 (six) hours as needed for moderate pain.     atorvastatin (LIPITOR) 20 MG tablet TAKE ONE TABLET BY MOUTH TWO TIMES WEEKLY 24 tablet 3   calcium carbonate (TUMS - DOSED IN MG ELEMENTAL CALCIUM) 500 MG chewable tablet Chew 1 tablet by mouth as needed for indigestion or heartburn.     Carboxymeth-Glyc-Polysorb PF (REFRESH OPTIVE MEGA-3) 0.5-1-0.5 % SOLN Place 1 drop into both eyes in the morning.     Carboxymethylcellulose Sodium (REFRESH LIQUIGEL OP) Place 1 drop into both eyes at bedtime.     cholecalciferol (VITAMIN D3) 25 MCG (1000 UNIT) tablet Take 1,000 Units by mouth in the morning and at bedtime.     denosumab (PROLIA) 60 MG/ML SOSY injection Inject 60 mg into the skin every 6 (six) months.     levothyroxine (SYNTHROID) 100 MCG tablet TAKE 1 TABLET BY MOUTH EVERY DAY 90 tablet 1   LORazepam (ATIVAN) 1 MG tablet TAKE 1 TABLET BY MOUTH AT BEDTIME AS NEEDED FOR ANXIETY 30 tablet 2   metroNIDAZOLE (METROCREAM) 0.75 % cream Apply 1 application topically daily as needed (rosacea).     naproxen sodium (ALEVE) 220 MG tablet Take 220 mg by mouth daily as needed (pain).     Psyllium (METAMUCIL) 48.57 % POWD Take 1 Scoop by mouth daily.     sertraline  (ZOLOFT) 50 MG tablet TAKE 1 AND 1/2 TABLETS BY MOUTH DAILY 135 tablet 1   vitamin B-12 (CYANOCOBALAMIN) 1000 MCG tablet Take 1,000 mcg by mouth daily.     Current Facility-Administered Medications on File Prior to Visit  Medication Dose Route Frequency Provider Last Rate Last Admin   denosumab (PROLIA) injection 60 mg  60 mg Subcutaneous Q6 months Crecencio Mc, MD   60 mg at 09/06/22 [id number]   Allergies  Allergen Reactions   Citalopram Nausea Only   Duloxetine Nausea Only   Shellfish Allergy Nausea Only    Mussels  only   Amoxicillin Rash    Rash and GI issues   Family History  Problem Relation Age of Onset   Peripheral Artery Disease Mother 55   Hip fracture Father 60   Osteoporosis Father    Melanoma Brother 55   Melanoma Brother    Stroke Maternal Grandfather 73   Stroke Paternal Grandmother 28   Pneumonia Paternal Grandfather 42   Breast cancer Other 3       [place]    PE: BP 122/78 (BP Location: Left Arm, Patient Position: Sitting, Cuff Size: Normal)   Pulse 65   Ht 5' 3"$  (1.6 m)   Wt 158 lb 6.4 oz (71.8 kg)   SpO2 98%   BMI 28.06 kg/m  Wt Readings from Last 3 Encounters:  12/01/22 158 lb 6.4 oz (71.8 kg)  11/29/22 153 lb (69.4 kg)  11/15/22 161 lb 3.2 oz (73.1 kg)   Constitutional: Normal weight, in NAD.  + mild kyphosis. Eyes: EOMI, no exophthalmos ENT: no thyromegaly, no cervical lymphadenopathy Cardiovascular: RRR, No MRG Respiratory: CTA B Musculoskeletal: no deformities, no tenderness at the spine percussion Skin:  no rashes Neurological: + mild tremor with outstretched hands  Assessment: 1. Osteoporosis  2. Vit D insufficiency  Plan: 1. Osteoporosis - likely postmenopausal/age-related, she has FH of OP - Discussed about increased risk of fracture, depending on the T score, greatly increased when the T score is lower than -  2.5, but it is actually a continuum and -2.5 should not be regarded as an absolute threshold. We reviewed her last 2 DXA  scan report together, and I explained that based on the T scores, she has an increased risk for fractures.  At the level of the 33% distal radius, the bone density appears to be worse, but this did not reach statistical significance (-3.8%).  We discussed that this is a site of cortical bone, in which antiosteoporotic medications have poor penetrance.  However, her ultra distal radius site is formed mostly by trabecular bone, which is more significantly impacted by antiosteoporosis medication.  Indeed, at this site, her T-score improved from -2.7 to -1.7, a significant improvement.  This site still appears to be improved despite the fact that she skipped 1 Prolia injection. - we reviewed her dietary and supplemental calcium and vitamin D intake. I recommended to make sure she gets at least 1200 mg of calcium daily -she is taking a supplement containing 1000 mg daily + milk and OJ.  She is also on 1600 units vitamin D daily. - discussed fall precautions with her and her daughter - given handout from [facility] Re: weight bearing exercises - advised to do this every day or at least 5/7 days - we discussed about maintaining a good amount of protein in her diet. The recommended daily protein intake is ~0.8 g per kilogram per day. I advised her to try to aim for this amount, since a diet low in proteins can exacerbate osteoporosis. Also, avoid smoking or >2 drinks of alcohol a day. - I recommended an alkaline diet and explained how to achieve this - We discussed about the different medication classes.  As of now, I do not feel that we need to use a bone anabolic agent.   - She is completing dental work.  Her dental graft has healed well. - we discussed about continuing with Prolia and not postponing doses anymore because this leads to bone density loss and significantly increased risk for fractures. - she continue to get Prolia through her PCP's office, which is just a mile from her  house. - will check a new DXA scan in 2 years after previous -  I explained that the first indication that the treatment is working is her not having anymore fractures. DXA scan changes are secondary: unchanged or slightly higher T-scores are desirable - will see pt back in a year  2. Vit D insufficiency - latest vit D level was normal - continue vit D supplement - 1600 units daily - she has follow-up with Dr. Derrel Nip next month  and we discussed to have another vitamin D level checked at that time  Philemon Kingdom, MD PhD [facility]

## 2022-12-01 NOTE — Patient Instructions (Addendum)
Please continue Prolia for now.  Try to have another vitamin D checked when you see Dr. Derrel Nip.  You should have an endocrinology follow-up appointment in 1 year.  How Can I Prevent Falls? Men and women with osteoporosis need to take care not to fall down. Falls can break bones. Some reasons people fall are: Poor vision  Poor balance  Certain diseases that affect how you walk  Some types of medicine, such as sleeping pills.  Some tips to help prevent falls outdoors are: Use a cane or walker  Wear rubber-soled shoes so you don't slip  Walk on grass when sidewalks are slippery  In winter, put salt or kitty litter on icy sidewalks.  Some ways to help prevent falls indoors are: Keep rooms free of clutter, especially on floors  Use plastic or carpet runners on slippery floors  Wear low-heeled shoes that provide good support  Do not walk in socks, stockings, or slippers  Be sure carpets and area rugs have skid-proof backs or are tacked to the floor  Be sure stairs are well lit and have rails on both sides  Put grab bars on bathroom walls near tub, shower, and toilet  Use a rubber bath mat in the shower or tub  Keep a flashlight next to your bed  Use a sturdy step stool with a handrail and wide steps  Add more lights in rooms (and night lights) Buy a cordless phone to keep with you so that you don't have to rush to the phone       when it rings and so that you can call for help if you fall.   (adapted from http://www.niams.NightlifePreviews.se)  How Can I Prevent Falls? Men and women with osteoporosis need to take care not to fall down. Falls can break bones. Some reasons people fall are: Poor vision  Poor balance  Certain diseases that affect how you walk  Some types of medicine, such as sleeping pills.  Some tips to help prevent falls outdoors are: Use a cane or walker  Wear rubber-soled shoes so you don't slip  Walk on grass when sidewalks are  slippery  In winter, put salt or kitty litter on icy sidewalks.  Some ways to help prevent falls indoors are: Keep rooms free of clutter, especially on floors  Use plastic or carpet runners on slippery floors  Wear low-heeled shoes that provide good support  Do not walk in socks, stockings, or slippers  Be sure carpets and area rugs have skid-proof backs or are tacked to the floor  Be sure stairs are well lit and have rails on both sides  Put grab bars on bathroom walls near tub, shower, and toilet  Use a rubber bath mat in the shower or tub  Keep a flashlight next to your bed  Use a sturdy step stool with a handrail and wide steps  Add more lights in rooms (and night lights) Buy a cordless phone to keep with you so that you don't have to rush to the phone       when it rings and so that you can call for help if you fall.   (adapted from http://www.niams.NightlifePreviews.se)  Exercise for Strong Bones (from [place]) There are two types of exercises that are important for building and maintaining bone density:  weight-bearing and muscle-strengthening exercises. Weight-bearing Exercises These exercises include activities that make you move against gravity while staying upright. Weight-bearing exercises can be high-impact or low-impact. High-impact weight-bearing exercises  help build bones and keep them strong. If you have broken a bone due to osteoporosis or are at risk of breaking a bone, you may need to avoid high-impact exercises. If you're not sure, you should check with your healthcare provider. Examples of high-impact weight-bearing exercises are: Dancing Doing high-impact aerobics Hiking Jogging/running Jumping Rope Stair climbing Tennis Low-impact weight-bearing exercises can also help keep bones strong and are a safe alternative if you cannot do high-impact exercises. Examples of low-impact weight-bearing exercises  are: Using elliptical training machines Doing low-impact aerobics Using stair-step machines Fast walking on a treadmill or outside Muscle-Strengthening Exercises These exercises include activities where you move your body, a weight or some other resistance against gravity. They are also known as resistance exercises and include: Lifting weights Using elastic exercise bands Using weight machines Lifting your own body weight Functional movements, such as standing and rising up on your toes Yoga and Pilates can also improve strength, balance and flexibility. However, certain positions may not be safe for people with osteoporosis or those at increased risk of broken bones. For example, exercises that have you bend forward may increase the chance of breaking a bone in the spine. A physical therapist should be able to help you learn which exercises are safe and appropriate for you. Non-Impact Exercises Non-impact exercises can help you to improve balance, posture and how well you move in everyday activities. These exercises can also help to increase muscle strength and decrease the risk of falls and broken bones. Some of these exercises include: Balance exercises that strengthen your legs and test your balance, such as Tai Chi, can decrease your risk of falls. Posture exercises that improve your posture and reduce rounded or "sloping" shoulders can help you decrease the chance of breaking a bone, especially in the spine. Functional exercises that improve how well you move can help you with everyday activities and decrease your chance of falling and breaking a bone. For example, if you have trouble getting up from a chair or climbing stairs, you should do these activities as exercises. A physical therapist can teach you balance, posture and functional exercises. Starting a New Exercise Program If you haven't exercised regularly for a while, check with your healthcare provider before beginning a new  exercise program--particularly if you have health problems such as heart disease, diabetes or high blood pressure. If you're at high risk of breaking a bone, you should work with a physical therapist to develop a safe exercise program. Once you have your healthcare provider's approval, start slowly. If you've already broken bones in the spine because of osteoporosis, be very careful to avoid activities that require reaching down, bending forward, rapid twisting motions, heavy lifting and those that increase your chance of a fall. As you get started, your muscles may feel sore for a day or two after you exercise. If soreness lasts longer, you may be working too hard and need to ease up. Exercises should be done in a pain-free range of motion. How Much Exercise Do You Need? Weight-bearing exercises 30 minutes on most days of the week. Do a 30-minutesession or multiple sessions spread out throughout the day. The benefits to your bones are the same.   Muscle-strengthening exercises Two to three days per week. If you don't have much time for strengthening/resistance training, do small amounts at a time. You can do just one body part each day. For example do arms one day, legs the next and trunk the next. You can also  spread these exercises out during your normal day.  Balance, posture and functional exercises Every day or as often as needed. You may want to focus on one area more than the others. If you have fallen or lose your balance, spend time doing balance exercises. If you are getting rounded shoulders, work more on posture exercises. If you have trouble climbing stairs or getting up from the couch, do more functional exercises. You can also perform these exercises at one time or spread them during your day. Work with a phyiscal therapist to learn the right exercises for you.

## 2022-12-04 ENCOUNTER — Encounter: Payer: Self-pay | Admitting: Gastroenterology

## 2022-12-20 ENCOUNTER — Telehealth: Payer: Self-pay | Admitting: Internal Medicine

## 2022-12-20 DIAGNOSIS — D649 Anemia, unspecified: Secondary | ICD-10-CM

## 2022-12-20 DIAGNOSIS — E785 Hyperlipidemia, unspecified: Secondary | ICD-10-CM

## 2022-12-20 DIAGNOSIS — E039 Hypothyroidism, unspecified: Secondary | ICD-10-CM

## 2022-12-20 DIAGNOSIS — R7301 Impaired fasting glucose: Secondary | ICD-10-CM

## 2022-12-20 NOTE — Telephone Encounter (Signed)
Pt called wanting blood work done before her 3/28 appointment

## 2022-12-21 NOTE — Telephone Encounter (Signed)
I have pended labs for your approval.  

## 2022-12-26 ENCOUNTER — Encounter: Payer: Self-pay | Admitting: Internal Medicine

## 2022-12-26 ENCOUNTER — Other Ambulatory Visit (INDEPENDENT_AMBULATORY_CARE_PROVIDER_SITE_OTHER): Payer: Medicare HMO

## 2022-12-26 DIAGNOSIS — E785 Hyperlipidemia, unspecified: Secondary | ICD-10-CM | POA: Diagnosis not present

## 2022-12-26 DIAGNOSIS — R7301 Impaired fasting glucose: Secondary | ICD-10-CM | POA: Diagnosis not present

## 2022-12-26 DIAGNOSIS — E039 Hypothyroidism, unspecified: Secondary | ICD-10-CM

## 2022-12-26 DIAGNOSIS — D649 Anemia, unspecified: Secondary | ICD-10-CM | POA: Diagnosis not present

## 2022-12-26 LAB — LIPID PANEL
Cholesterol: 165 mg/dL (ref 0–200)
HDL: 60.7 mg/dL (ref >39.00)
LDL Cholesterol: 82 mg/dL (ref 0–99)
NonHDL: 104.21
Total CHOL/HDL Ratio: 3
Triglycerides: 110 mg/dL (ref 0.0–149.0)
VLDL: 22 mg/dL (ref 0.0–40.0)

## 2022-12-26 LAB — HEMOGLOBIN A1C: Hgb A1c MFr Bld: 5.5 % (ref 4.6–6.5)

## 2022-12-26 LAB — COMPREHENSIVE METABOLIC PANEL
ALT: 12 U/L (ref 0–35)
AST: 21 U/L (ref 0–37)
Albumin: 4 g/dL (ref 3.5–5.2)
Alkaline Phosphatase: 31 U/L — ABNORMAL LOW (ref 39–117)
BUN: 18 mg/dL (ref 6–23)
CO2: 26 mEq/L (ref 19–32)
Calcium: 9 mg/dL (ref 8.4–10.5)
Chloride: 105 mEq/L (ref 96–112)
Creatinine, Ser: 0.91 mg/dL (ref 0.40–1.20)
GFR: 58.88 mL/min — ABNORMAL LOW (ref >60.00)
Glucose, Bld: 85 mg/dL (ref 70–99)
Potassium: 4.5 mEq/L (ref 3.5–5.1)
Sodium: 140 mEq/L (ref 135–145)
Total Bilirubin: 0.5 mg/dL (ref 0.2–1.2)
Total Protein: 6.3 g/dL (ref 6.0–8.3)

## 2022-12-26 LAB — CBC WITH DIFFERENTIAL/PLATELET
Basophils Absolute: 0 10*3/uL (ref 0.0–0.1)
Basophils Relative: 1 % (ref 0.0–3.0)
Eosinophils Absolute: 0.1 10*3/uL (ref 0.0–0.7)
Eosinophils Relative: 1.4 % (ref 0.0–5.0)
HCT: 39.3 % (ref 36.0–46.0)
Hemoglobin: 13.2 g/dL (ref 12.0–15.0)
Lymphocytes Relative: 28.2 % (ref 12.0–46.0)
Lymphs Abs: 1.4 10*3/uL (ref 0.7–4.0)
MCHC: 33.6 g/dL (ref 30.0–36.0)
MCV: 93.3 fl (ref 78.0–100.0)
Monocytes Absolute: 0.6 10*3/uL (ref 0.1–1.0)
Monocytes Relative: 12.7 % — ABNORMAL HIGH (ref 3.0–12.0)
Neutro Abs: 2.8 10*3/uL (ref 1.4–7.7)
Neutrophils Relative %: 56.7 % (ref 43.0–77.0)
Platelets: 141 10*3/uL — ABNORMAL LOW (ref 150.0–400.0)
RBC: 4.21 Mil/uL (ref 3.87–5.11)
RDW: 14.2 % (ref 11.5–15.5)
WBC: 4.9 10*3/uL (ref 4.0–10.5)

## 2022-12-26 LAB — TSH: TSH: 1.41 u[IU]/mL (ref 0.35–5.50)

## 2022-12-26 LAB — LDL CHOLESTEROL, DIRECT: Direct LDL: 76 mg/dL

## 2022-12-30 ENCOUNTER — Telehealth: Payer: Self-pay | Admitting: Internal Medicine

## 2022-12-30 NOTE — Telephone Encounter (Signed)
Contacted Shelly Coss to schedule their annual wellness visit. Appointment made for 01/09/2023.  Thank you,  [place] Direct dial  [phone]

## 2023-01-05 ENCOUNTER — Ambulatory Visit (INDEPENDENT_AMBULATORY_CARE_PROVIDER_SITE_OTHER): Payer: Medicare HMO | Admitting: Internal Medicine

## 2023-01-05 ENCOUNTER — Telehealth: Payer: Self-pay | Admitting: Internal Medicine

## 2023-01-05 ENCOUNTER — Encounter: Payer: Self-pay | Admitting: Internal Medicine

## 2023-01-05 VITALS — BP 126/76 | HR 69 | Temp 97.6°F | Ht 63.0 in | Wt 155.4 lb

## 2023-01-05 DIAGNOSIS — I700 Atherosclerosis of aorta: Secondary | ICD-10-CM

## 2023-01-05 DIAGNOSIS — R195 Other fecal abnormalities: Secondary | ICD-10-CM | POA: Diagnosis not present

## 2023-01-05 DIAGNOSIS — F410 Panic disorder [episodic paroxysmal anxiety] without agoraphobia: Secondary | ICD-10-CM

## 2023-01-05 DIAGNOSIS — E559 Vitamin D deficiency, unspecified: Secondary | ICD-10-CM

## 2023-01-05 DIAGNOSIS — R296 Repeated falls: Secondary | ICD-10-CM

## 2023-01-05 DIAGNOSIS — E039 Hypothyroidism, unspecified: Secondary | ICD-10-CM

## 2023-01-05 DIAGNOSIS — D696 Thrombocytopenia, unspecified: Secondary | ICD-10-CM

## 2023-01-05 DIAGNOSIS — F5105 Insomnia due to other mental disorder: Secondary | ICD-10-CM

## 2023-01-05 DIAGNOSIS — R944 Abnormal results of kidney function studies: Secondary | ICD-10-CM

## 2023-01-05 DIAGNOSIS — I708 Atherosclerosis of other arteries: Secondary | ICD-10-CM

## 2023-01-05 DIAGNOSIS — E785 Hyperlipidemia, unspecified: Secondary | ICD-10-CM | POA: Diagnosis not present

## 2023-01-05 DIAGNOSIS — M816 Localized osteoporosis [Lequesne]: Secondary | ICD-10-CM

## 2023-01-05 DIAGNOSIS — F409 Phobic anxiety disorder, unspecified: Secondary | ICD-10-CM

## 2023-01-05 DIAGNOSIS — F411 Generalized anxiety disorder: Secondary | ICD-10-CM

## 2023-01-05 DIAGNOSIS — N183 Chronic kidney disease, stage 3 unspecified: Secondary | ICD-10-CM | POA: Insufficient documentation

## 2023-01-05 MED ORDER — LORAZEPAM 1 MG PO TABS: ORAL_TABLET | ORAL | 2 refills | Status: DC

## 2023-01-05 MED ORDER — SERTRALINE HCL 50 MG PO TABS: 75.0000 mg | ORAL_TABLET | Freq: Every day | ORAL | 1 refills | Status: DC

## 2023-01-05 NOTE — Telephone Encounter (Signed)
LMTCB. Please let pt know that the Lorazepam was sent in this morning she will need to call the pharmacy back.

## 2023-01-05 NOTE — Progress Notes (Signed)
Subjective:  Patient ID: Michelle Butler, female    DOB: October 29, 1940  Age: 82 y.o. MRN: [id number]  CC: The primary encounter diagnosis was Hypothyroidism (acquired). Diagnoses of Hyperlipidemia with target LDL less than 130, Vitamin D deficiency, Positive occult stool blood test, Decreased GFR, Generalized anxiety disorder with panic attacks, Atherosclerosis of aortic bifurcation and common iliac arteries ([place]), Insomnia due to anxiety and fear, Thrombocytopenia ([place]), Frequent falls, and Localized osteoporosis without current pathological fracture were also pertinent to this visit.   HPI Michelle Butler presents for  Chief Complaint  Patient presents with   Medical Management of Chronic Issues   Reviewed recent visit with Dr Cruzita Lederer regarding hypothyroidism and osteoporosis. She was recently advised to chew levothyroxine  and avoid calcium  supplements and not postponing Prolia injections . Most recent was Nov 2023.    Taking 1000-2000 IUs of D3 using various sources.  Discussedgoal of calcium intake 1200 vs 1800  Last vitamin D Lab Results  Component Value Date   VD25OH 31.91 07/07/2022     Anxiety:  using lorazepam less than once a week(15 since September) for early wake ups 1/2 tablet   Decreased GFR :  she has been taking naproxen  on a daily basis for over 6 months for left shoulder pain   Bilateral hearing aids since last visit    Outpatient Medications Prior to Visit  Medication Sig Dispense Refill   acetaminophen (TYLENOL) 500 MG tablet Take 500 mg by mouth every 6 (six) hours as needed for moderate pain.     atorvastatin (LIPITOR) 20 MG tablet TAKE ONE TABLET BY MOUTH TWO TIMES WEEKLY 24 tablet 3   calcium carbonate (TUMS - DOSED IN MG ELEMENTAL CALCIUM) 500 MG chewable tablet Chew 1 tablet by mouth as needed for indigestion or heartburn.     Carboxymeth-Glyc-Polysorb PF (REFRESH OPTIVE MEGA-3) 0.5-1-0.5 % SOLN Place 1 drop into both eyes in the morning.      Carboxymethylcellulose Sodium (REFRESH LIQUIGEL OP) Place 1 drop into both eyes at bedtime.     cholecalciferol (VITAMIN D3) 25 MCG (1000 UNIT) tablet Take 1,000 Units by mouth in the morning and at bedtime.     denosumab (PROLIA) 60 MG/ML SOSY injection Inject 60 mg into the skin every 6 (six) months.     levothyroxine (SYNTHROID) 100 MCG tablet TAKE 1 TABLET BY MOUTH EVERY DAY 90 tablet 1   metroNIDAZOLE (METROCREAM) 0.75 % cream Apply 1 application topically daily as needed (rosacea).     naproxen sodium (ALEVE) 220 MG tablet Take 220 mg by mouth daily as needed (pain).     Psyllium (METAMUCIL) 48.57 % POWD Take 1 Scoop by mouth daily.     vitamin B-12 (CYANOCOBALAMIN) 1000 MCG tablet Take 1,000 mcg by mouth daily.     LORazepam (ATIVAN) 1 MG tablet TAKE 1 TABLET BY MOUTH AT BEDTIME AS NEEDED FOR ANXIETY 30 tablet 2   sertraline (ZOLOFT) 50 MG tablet TAKE 1 AND 1/2 TABLETS BY MOUTH DAILY 135 tablet 1   Facility-Administered Medications Prior to Visit  Medication Dose Route Frequency Provider Last Rate Last Admin   denosumab (PROLIA) injection 60 mg  60 mg Subcutaneous Q6 months Crecencio Mc, MD   60 mg at 09/06/22 [id number]    Review of Systems;  Patient denies headache, fevers, malaise, unintentional weight loss, skin rash, eye pain, sinus congestion and sinus pain, sore throat, dysphagia,  hemoptysis , cough, dyspnea, wheezing, chest pain, palpitations, orthopnea, edema, abdominal pain, nausea, melena, diarrhea,  constipation, flank pain, dysuria, hematuria, urinary  Frequency, nocturia, numbness, tingling, seizures,  Focal weakness, Loss of consciousness,  Tremor, insomnia, depression, anxiety, and suicidal ideation.      Objective:  BP 126/76   Pulse 69   Temp 97.6 F (36.4 C) (Oral)   Ht 5\' 3"  (1.6 m)   Wt 155 lb 6.4 oz (70.5 kg)   SpO2 99%   BMI 27.53 kg/m   BP Readings from Last 3 Encounters:  01/05/23 126/76  12/01/22 122/78  11/29/22 128/64    Wt Readings from Last 3  Encounters:  01/05/23 155 lb 6.4 oz (70.5 kg)  12/01/22 158 lb 6.4 oz (71.8 kg)  11/29/22 153 lb (69.4 kg)    Physical Exam Vitals reviewed.  Constitutional:      General: She is not in acute distress.    Appearance: Normal appearance. She is normal weight. She is not ill-appearing, toxic-appearing or diaphoretic.  HENT:     Head: Normocephalic.  Eyes:     General: No scleral icterus.       Right eye: No discharge.        Left eye: No discharge.     Conjunctiva/sclera: Conjunctivae normal.  Neck:     Vascular: Carotid bruit present.  Cardiovascular:     Rate and Rhythm: Normal rate and regular rhythm.     Heart sounds: Normal heart sounds.  Pulmonary:     Effort: Pulmonary effort is normal. No respiratory distress.     Breath sounds: Normal breath sounds.  Musculoskeletal:        General: Normal range of motion.  Skin:    General: Skin is warm and dry.  Neurological:     General: No focal deficit present.     Mental Status: She is alert and oriented to person, place, and time. Mental status is at baseline.  Psychiatric:        Mood and Affect: Mood normal.        Behavior: Behavior normal.        Thought Content: Thought content normal.        Judgment: Judgment normal.     Lab Results  Component Value Date   HGBA1C 5.5 12/26/2022   HGBA1C 5.5 07/04/2022   HGBA1C 5.6 12/28/2021    Lab Results  Component Value Date   CREATININE 0.91 12/26/2022   CREATININE 0.92 07/04/2022   CREATININE 1.11 (H) 06/25/2022    Lab Results  Component Value Date   WBC 4.9 12/26/2022   HGB 13.2 12/26/2022   HCT 39.3 12/26/2022   PLT 141.0 (L) 12/26/2022   GLUCOSE 85 12/26/2022   CHOL 165 12/26/2022   TRIG 110.0 12/26/2022   HDL 60.70 12/26/2022   LDLDIRECT 76.0 12/26/2022   LDLCALC 82 12/26/2022   ALT 12 12/26/2022   AST 21 12/26/2022   NA 140 12/26/2022   K 4.5 12/26/2022   CL 105 12/26/2022   CREATININE 0.91 12/26/2022   BUN 18 12/26/2022   CO2 26 12/26/2022   TSH  1.41 12/26/2022   HGBA1C 5.5 12/26/2022    No results found.  Assessment & Plan:  .Hypothyroidism (acquired) Assessment & Plan: Normal thyroid functio on  current dose of 100 mcg daily   Lab Results  Component Value Date   TSH 1.41 12/26/2022      Hyperlipidemia with target LDL less than 130 -     Comprehensive metabolic panel; Future  Vitamin D deficiency -     VITAMIN D 25 Hydroxy (Vit-D  Deficiency, Fractures); Future  Positive occult stool blood test Assessment & Plan: Had normal colonoscopy feb 2024    Decreased GFR Assessment & Plan: Using NSAIDs daily for residual left  shoulder pain .  She was advised to stop naproxen and rtc one month    Generalized anxiety disorder with panic attacks Assessment & Plan: Requesting refill of lorazepam for prn use    Atherosclerosis of aortic bifurcation and common iliac arteries [facility]) Assessment & Plan: Managed with atorvastatin twice weekly   Lab Results  Component Value Date   CHOL 165 12/26/2022   HDL 60.70 12/26/2022   LDLCALC 82 12/26/2022   LDLDIRECT 76.0 12/26/2022   TRIG 110.0 12/26/2022   CHOLHDL 3 12/26/2022      Insomnia due to anxiety and fear Assessment & Plan: She uses 1/2 tablet of lorazepam  no more than once or  twice weekly . The risks and benefits of  Chronic  benzodiazepine use were reviewed with patient today including increased risk of sedation if combined with alcohol.     Thrombocytopenia ([facility]) Assessment & Plan: Stable,  No workup needed unless platelets fall below 50K   Frequent falls Assessment & Plan: Her falls were mechanical but likely facilitated by proximal muscle weakness .  She has completed PT to improve proximal muscle strength    Localized osteoporosis without current pathological fracture Assessment & Plan:   Prior trials of alendronate  Were  not tolerated due to pill esophagitis ,  evista caused headaches /migraines.  She is tolerating Prolia and has suspended  temporarily due to oral surgery and will resume in November.  Last DEXA was in 2021 at start of therapy and T scores have worsened by repeat evaluation in 2023.  No change in therapy per Endocrinology evaluation   Other orders -     Sertraline HCl; Take 1.5 tablets (75 mg total) by mouth daily.  Dispense: 135 tablet; Refill: 1 -     LORazepam; TAKE 1   TABLET BY MOUTH AT BEDTIME AS NEEDED FOR ANXIETY  Dispense: 30 tablet; Refill: 2     I provided 32 minutes of face-to-face time during this encounter reviewing patient's last visit with me, patient's  most recent visit with Endocrinology,  recent surgical and non surgical procedures, previous  labs and imaging studies, counseling on currently addressed issues,  and post visit ordering to diagnostics and therapeutics .   Follow-up: Return in about 6 months (around 07/08/2023).   Crecencio Mc, MD

## 2023-01-05 NOTE — Assessment & Plan Note (Signed)
Managed with atorvastatin twice weekly   Lab Results  Component Value Date   CHOL 165 12/26/2022   HDL 60.70 12/26/2022   LDLCALC 82 12/26/2022   LDLDIRECT 76.0 12/26/2022   TRIG 110.0 12/26/2022   CHOLHDL 3 12/26/2022

## 2023-01-05 NOTE — Assessment & Plan Note (Addendum)
Using NSAIDs daily for residual left  shoulder pain .  She was advised to stop naproxen and rtc one month

## 2023-01-05 NOTE — Telephone Encounter (Signed)
Pt would like to be called regarding a medication problem that she has

## 2023-01-05 NOTE — Telephone Encounter (Signed)
Spoke with pt and she stated that Lorazepam was supposed to be sent in this morning at her appt but Sertraline got sent in instead. Pt is wanting to know if the Lorazepam can be sent to pharmacy.

## 2023-01-05 NOTE — Assessment & Plan Note (Addendum)
Had normal colonoscopy feb 2024

## 2023-01-05 NOTE — Patient Instructions (Signed)
The decreased GFR means that something has diminished your kidney function.  This was first noticed in September, and may be due to use of NSAIDs.   1)  Stop all NSAIDs ( naproxen,  aleve,  motrin advil  and ibuprofen,  meloxicam, diclofenac )  2) You can  use  up to 2000 mg of acetominophen (tylenol) every day safely  In divided doses (500 mg every 6 hours  Or 1000 mg every 12 hours.)   Repeat non fasting labs in one month   2) calcium goal is 1200 to 1800.  I800 is difficult to obtain;  if you get 1200 daily you are better than most people!  3) platelets are STABLE and only minimally low  no workup needed  4) continue current thyroid dose

## 2023-01-05 NOTE — Assessment & Plan Note (Signed)
Requesting refill of lorazepam for prn use

## 2023-01-07 NOTE — Assessment & Plan Note (Signed)
Prior trials of alendronate  Were  not tolerated due to pill esophagitis ,  evista caused headaches /migraines.  She is tolerating Prolia and has suspended temporarily due to oral surgery and will resume in November.  Last DEXA was in 2021 at start of therapy and T scores have worsened by repeat evaluation in 2023.  No change in therapy per Endocrinology evaluation

## 2023-01-07 NOTE — Assessment & Plan Note (Signed)
Stable,  No workup needed unless platelets fall below 50K ?

## 2023-01-07 NOTE — Assessment & Plan Note (Signed)
She uses 1/2 tablet of lorazepam  no more than once or  twice weekly . The risks and benefits of  Chronic  benzodiazepine use were reviewed with patient today including increased risk of sedation if combined with alcohol.

## 2023-01-07 NOTE — Assessment & Plan Note (Signed)
Her falls were mechanical but likely facilitated by proximal muscle weakness .  She has completed PT to improve proximal muscle strength

## 2023-01-07 NOTE — Assessment & Plan Note (Signed)
Normal thyroid functio on  current dose of 100 mcg daily   Lab Results  Component Value Date   TSH 1.41 12/26/2022

## 2023-01-09 ENCOUNTER — Ambulatory Visit (INDEPENDENT_AMBULATORY_CARE_PROVIDER_SITE_OTHER): Payer: Medicare HMO

## 2023-01-09 VITALS — Ht 63.0 in | Wt 155.0 lb

## 2023-01-09 DIAGNOSIS — Z1231 Encounter for screening mammogram for malignant neoplasm of breast: Secondary | ICD-10-CM

## 2023-01-09 DIAGNOSIS — Z0000 Encounter for general adult medical examination without abnormal findings: Secondary | ICD-10-CM

## 2023-01-09 NOTE — Progress Notes (Addendum)
Subjective:   Michelle Butler is a 82 y.o. female who presents for Medicare Annual (Subsequent) preventive examination.  Review of Systems    No ROS.  Medicare Wellness Virtual Visit.  Visual/audio telehealth visit, UTA vital signs.   See social history for additional risk factors.   Cardiac Risk Factors include: advanced age (>71men, >29 women)     Objective:    Today's Vitals   01/09/23 1336  Weight: 155 lb (70.3 kg)  Height: 5\' 3"  (1.6 m)   Body mass index is 27.46 kg/m.     01/09/2023    1:38 PM 11/29/2022    9:48 AM 01/03/2022    1:25 PM 10/15/2021   10:37 AM 10/08/2021   11:16 AM 12/16/2020   12:50 PM 12/16/2019   12:42 PM  Advanced Directives  Does Patient Have a Medical Advance Directive? Yes Yes Yes Yes Yes Yes Yes  Type of Paramedic of [place];Living will  [place];Living will [place];Living will [place];Living will [place];Living will [place];Living will  Does patient want to make changes to medical advance directive? No - Patient declined  No - Patient declined No - Patient declined  No - Patient declined No - Patient declined  Copy of [place] in Chart? Yes - validated most recent copy scanned in chart (See row information)  Yes - validated most recent copy scanned in chart (See row information) Yes - validated most recent copy scanned in chart (See row information)  No - copy requested No - copy requested    Current Medications (verified) Outpatient Encounter Medications as of 01/09/2023  Medication Sig   acetaminophen (TYLENOL) 500 MG tablet Take 500 mg by mouth every 6 (six) hours as needed for moderate pain.   atorvastatin (LIPITOR) 20 MG tablet TAKE ONE TABLET BY MOUTH TWO TIMES WEEKLY   calcium carbonate (TUMS - DOSED IN MG ELEMENTAL CALCIUM) 500 MG chewable tablet Chew 1 tablet by mouth as needed for indigestion or  heartburn.   Carboxymeth-Glyc-Polysorb PF (REFRESH OPTIVE MEGA-3) 0.5-1-0.5 % SOLN Place 1 drop into both eyes in the morning.   Carboxymethylcellulose Sodium (REFRESH LIQUIGEL OP) Place 1 drop into both eyes at bedtime.   cholecalciferol (VITAMIN D3) 25 MCG (1000 UNIT) tablet Take 1,000 Units by mouth in the morning and at bedtime.   denosumab (PROLIA) 60 MG/ML SOSY injection Inject 60 mg into the skin every 6 (six) months.   levothyroxine (SYNTHROID) 100 MCG tablet TAKE 1 TABLET BY MOUTH EVERY DAY   LORazepam (ATIVAN) 1 MG tablet TAKE 1   TABLET BY MOUTH AT BEDTIME AS NEEDED FOR ANXIETY   metroNIDAZOLE (METROCREAM) 0.75 % cream Apply 1 application topically daily as needed (rosacea).   naproxen sodium (ALEVE) 220 MG tablet Take 220 mg by mouth daily as needed (pain).   Psyllium (METAMUCIL) 48.57 % POWD Take 1 Scoop by mouth daily.   sertraline (ZOLOFT) 50 MG tablet Take 1.5 tablets (75 mg total) by mouth daily.   vitamin B-12 (CYANOCOBALAMIN) 1000 MCG tablet Take 1,000 mcg by mouth daily.   Facility-Administered Encounter Medications as of 01/09/2023  Medication   denosumab (PROLIA) injection 60 mg    Allergies (verified) Citalopram, Duloxetine, Shellfish allergy, and Amoxicillin   History: Past Medical History:  Diagnosis Date   Anxiety    Cancer    basal cell nose   GERD (gastroesophageal reflux disease)    History of methicillin  resistant staphylococcus aureus (MRSA) 2012   Hyperlipidemia    Hypothyroidism    Past Surgical History:  Procedure Laterality Date   APPENDECTOMY  1959   COLONOSCOPY     COLONOSCOPY WITH PROPOFOL N/A 11/29/2022   Procedure: COLONOSCOPY WITH PROPOFOL;  Surgeon: Lin Landsman, MD;  Location: [facility] ENDOSCOPY;  Service: Gastroenterology;  Laterality: N/A;   HERNIA REPAIR  2013   Right side with mesh   INGUINAL HERNIA REPAIR Left 10/15/2021   Procedure: HERNIA REPAIR INGUINAL ADULT;  Surgeon: Robert Bellow, MD;  Location: [facility] ORS;  Service:  General;  Laterality: Left;   INSERTION OF MESH  10/15/2021   Procedure: INSERTION OF MESH;  Surgeon: Robert Bellow, MD;  Location: [facility] ORS;  Service: General;;   left hip replacement  2011   right hip replacement  [place]   Family History  Problem Relation Age of Onset   Peripheral Artery Disease Mother 76   Hip fracture Father 10   Osteoporosis Father    Melanoma Brother 22   Melanoma Brother    Stroke Maternal Grandfather 21   Stroke Paternal Grandmother 35   Pneumonia Paternal Grandfather 16   Breast cancer Other 75       [place]   Social History   Socioeconomic History   Marital status: Married    Spouse name: Not on file   Number of children: 4   Years of education: Not on file   Highest education level: Not on file  Occupational History   Not on file  Tobacco Use   Smoking status: Never   Smokeless tobacco: Never  Vaping Use   Vaping Use: Never used  Substance and Sexual Activity   Alcohol use: Yes    Alcohol/week: 2.0 standard drinks of alcohol    Types: 2 Glasses of wine per week    Comment: wine rare   Drug use: Never   Sexual activity: Not on file  Other Topics Concern   Not on file  Social History Narrative   Married    [place]   Social Determinants of Health   Financial Resource Strain: Low Risk  (01/07/2023)   Overall Financial Resource Strain (CARDIA)    Difficulty of Paying Living Expenses: Not hard at all  Food Insecurity: No Food Insecurity (01/07/2023)   Hunger Vital Sign    Worried About Running Out of Food in the Last Year: Never true    Ran Out of Food in the Last Year: Never true  Transportation Needs: No Transportation Needs (01/07/2023)   PRAPARE - Hydrologist (Medical): No    Lack of Transportation (Non-Medical): No  Physical Activity: Sufficiently Active (01/07/2023)   Exercise Vital Sign    Days of Exercise per Week:  5 days    Minutes of Exercise per Session: 40 min  Stress: No Stress Concern Present (01/07/2023)   [place]    Feeling of Stress : Only a little  Social Connections: Unknown (01/07/2023)   Social Connection and Isolation Panel [NHANES]    Frequency of Communication with Friends and Family: More than three times a week    Frequency of Social Gatherings with Friends and Family: More than three times a week    Attends Religious Services: Not on file    Active Member of Clubs or Organizations: Yes  Attends Music therapist: More than 4 times per year    Marital Status: Married    Tobacco Counseling Counseling given: Not Answered   Clinical Intake:  Pre-visit preparation completed: Yes        Diabetes: No  How often do you need to have someone help you when you read instructions, pamphlets, or other written materials from your doctor or pharmacy?: 1 - Never    Interpreter Needed?: No      Activities of Daily Living    01/07/2023    6:56 AM  In your present state of health, do you have any difficulty performing the following activities:  Hearing? 1  Vision? 0  Difficulty concentrating or making decisions? 0  Walking or climbing stairs? 0  Dressing or bathing? 0  Doing errands, shopping? 0  Preparing Food and eating ? N  Using the Toilet? N  In the past six months, have you accidently leaked urine? Y  Do you have problems with loss of bowel control? N  Managing your Medications? N  Managing your Finances? N  Housekeeping or managing your Housekeeping? N    Patient Care Team: Crecencio Mc, MD as PCP - General (Internal Medicine)  Indicate any recent Medical Services you may have received from other than [facility] providers in the past year (date may be approximate).     Assessment:   This is a routine wellness examination for [facility].  Patient Medicare AWV questionnaire was  completed by the patient on 01/07/23, I have confirmed that all information answered by patient is correct and no changes since this date.   I connected with  Shelly Coss on 01/09/23 by a audio enabled telemedicine application and verified that I am speaking with the correct person using two identifiers.  Patient Location: Home  Provider Location: Office/Clinic  I discussed the limitations of evaluation and management by telemedicine. The patient expressed understanding and agreed to proceed.   Hearing/Vision screen Hearing Screening - Comments:: Hearing aid, bilateral  Vision Screening - Comments:: Followed by [facility] Wears corrective lenses They have seen their ophthalmologist in the last 12 months.    Dietary issues and exercise activities discussed: Current Exercise Habits: Structured exercise class, Time (Minutes): 40, Frequency (Times/Week): 5, Weekly Exercise (Minutes/Week): 200, Intensity: Mild   Goals Addressed               This Visit's Progress     Patient Stated     Weight (lb) < 157 lb (71.2 kg) (pt-stated)   155 lb (70.3 kg)     I would like to weigh around 140-145lb. Eat healthier, portion control. Reduce sugar intake.       Depression Screen    01/09/2023    1:40 PM 01/05/2023    8:05 AM 05/02/2022   10:01 AM 01/03/2022    1:24 PM 07/02/2021    9:28 AM 04/27/2021   12:59 PM 12/17/2020    9:37 AM  PHQ 2/9 Scores  PHQ - 2 Score 0 0 0 0 1 0 0    Fall Risk    01/07/2023    6:56 AM 01/05/2023    8:05 AM 07/07/2022    8:09 AM 05/02/2022   10:01 AM 01/03/2022    1:27 PM  Fall Risk   Falls in the past year? 1 0 1 1 0  Number falls in past yr: 1 0 0 1 0  Injury with Fall? 1 0 1 1   Risk for fall  due to : History of fall(s) No Fall Risks History of fall(s) History of fall(s)   Follow up Falls evaluation completed;Falls prevention discussed Falls evaluation completed Falls evaluation completed Falls evaluation completed Falls evaluation  completed    FALL RISK PREVENTION PERTAINING TO THE HOME: Home free of loose throw rugs in walkways, pet beds, electrical cords, etc? Yes  Adequate lighting in your home to reduce risk of falls? Yes   ASSISTIVE DEVICES UTILIZED TO PREVENT FALLS: Life alert? No  Pull chords? Yes Use of a cane, walker or w/c? No  Grab bars in the bathroom? Yes  Shower chair or bench in shower? No  Elevated toilet seat or a handicapped toilet? Yes   TIMED UP AND GO: Was the test performed? No .   Cognitive Function:        01/09/2023    1:51 PM 12/16/2019    1:00 PM  6CIT Screen  What Year? 0 points 0 points  What month? 0 points 0 points  What time? 0 points 0 points  Count back from 20 0 points 0 points  Months in reverse 0 points 0 points  Repeat phrase 0 points 0 points  Total Score 0 points 0 points    Immunizations Immunization History  Administered Date(s) Administered   COVID-19, mRNA, vaccine(Comirnaty)12 years and older 08/05/2022   Fluad Quad(high Dose 65+) 06/18/2019, 07/02/2021   Hepatitis A 06/10/2005   Influenza, High Dose Seasonal PF 07/18/2022   Influenza-Unspecified 07/16/2020   Moderna Covid-19 Vaccine Bivalent Booster 14yrs & up 06/28/2021, 03/08/2022   Moderna Sars-Covid-2 Vaccination 10/22/2019, 11/19/2019, 08/25/2020, 02/25/2021   Pneumococcal Conjugate-13 07/10/2014   Pneumococcal Polysaccharide-23 07/10/2002, 08/25/2008, 06/11/2019   Respiratory Syncytial Virus Vaccine,Recomb Aduvanted(Arexvy) 09/16/2022   Tdap 08/15/2008, 05/29/2020   Zoster Recombinat (Shingrix) 07/10/2019, 09/23/2019   Zoster, Live 01/14/2010   Screening Tests Health Maintenance  Topic Date Due   COVID-19 Vaccine (8 - 2023-24 season) 01/20/2023 (Originally 09/30/2022)   INFLUENZA VACCINE  05/11/2023   Medicare Annual Wellness (AWV)  01/09/2024   DTaP/Tdap/Td (3 - Td or Tdap) 05/29/2030   Pneumonia Vaccine 58+ Years old  Completed   DEXA SCAN  Completed   Zoster Vaccines- Shingrix   Completed   HPV VACCINES  Aged Out   Health Maintenance There are no preventive care reminders to display for this patient.  Mammogram- ordered. Number to call for scheduling [phone].   Lung Cancer Screening: (Low Dose CT Chest recommended if Age 21-80 years, 30 pack-year currently smoking OR have quit w/in 15years.) does not qualify.   Hepatitis C Screening: does not qualify  Vision Screening: Recommended annual ophthalmology exams for early detection of glaucoma and other disorders of the eye.  Dental Screening: Recommended annual dental exams for proper oral hygiene  Community Resource Referral / Chronic Care Management: CRR required this visit?  No   CCM required this visit?  No      Plan:     I have personally reviewed and noted the following in the patient's chart:   Medical and social history Use of alcohol, tobacco or illicit drugs  Current medications and supplements including opioid prescriptions. Patient is not currently taking opioid prescriptions. Functional ability and status Nutritional status Physical activity Advanced directives List of other physicians Hospitalizations, surgeries, and ER visits in previous 12 months Vitals Screenings to include cognitive, depression, and falls Referrals and appointments  In addition, I have reviewed and discussed with patient certain preventive protocols, quality metrics, and best practice recommendations. A written  personalized care plan for preventive services as well as general preventive health recommendations were provided to patient.     Eliyahu Bille L Motley, LPN   05/11/5052     I have reviewed the above information and agree with above.   Duncan Dull, MD

## 2023-01-09 NOTE — Telephone Encounter (Signed)
Pt returned Michelle Butler CMA call. Note below was read to pt. Pt aware of her med its at pharmacy.

## 2023-01-09 NOTE — Patient Instructions (Addendum)
Michelle Butler , Thank you for taking time to come for your Medicare Wellness Visit. I appreciate your ongoing commitment to your health goals. Please review the following plan we discussed and let me know if I can assist you in the future.   These are the goals we discussed:  Goals       Patient Stated     Weight (lb) < 157 lb (71.2 kg) (pt-stated)      I would like to weigh around 140-145lb. Eat healthier, portion control. Reduce sugar intake.        This is a list of the screening recommended for you and due dates:  Health Maintenance  Topic Date Due   COVID-19 Vaccine (8 - 2023-24 season) 01/20/2023*   Flu Shot  05/11/2023   Medicare Annual Wellness Visit  01/09/2024   DTaP/Tdap/Td vaccine (3 - Td or Tdap) 05/29/2030   Pneumonia Vaccine  Completed   DEXA scan (bone density measurement)  Completed   Zoster (Shingles) Vaccine  Completed   HPV Vaccine  Aged Out  *Topic was postponed. The date shown is not the original due date.    Advanced directives: on file  Conditions/risks identified: none new  Next appointment: Follow up in one year for your annual wellness visit    Preventive Care 65 Years and Older, Female Preventive care refers to lifestyle choices and visits with your health care provider that can promote health and wellness. What does preventive care include? A yearly physical exam. This is also called an annual well check. Dental exams once or twice a year. Routine eye exams. Ask your health care provider how often you should have your eyes checked. Personal lifestyle choices, including: Daily care of your teeth and gums. Regular physical activity. Eating a healthy diet. Avoiding tobacco and drug use. Limiting alcohol use. Practicing safe sex. Taking low-dose aspirin every day. Taking vitamin and mineral supplements as recommended by your health care provider. What happens during an annual well check? The services and screenings done by your health care  provider during your annual well check will depend on your age, overall health, lifestyle risk factors, and family history of disease. Counseling  Your health care provider may ask you questions about your: Alcohol use. Tobacco use. Drug use. Emotional well-being. Home and relationship well-being. Sexual activity. Eating habits. History of falls. Memory and ability to understand (cognition). Work and work Statistician. Reproductive health. Screening  You may have the following tests or measurements: Height, weight, and BMI. Blood pressure. Lipid and cholesterol levels. These may be checked every 5 years, or more frequently if you are over 26 years old. Skin check. Lung cancer screening. You may have this screening every year starting at age 74 if you have a 30-pack-year history of smoking and currently smoke or have quit within the past 15 years. Fecal occult blood test (FOBT) of the stool. You may have this test every year starting at age 74. Flexible sigmoidoscopy or colonoscopy. You may have a sigmoidoscopy every 5 years or a colonoscopy every 10 years starting at age 49. Hepatitis C blood test. Hepatitis B blood test. Sexually transmitted disease (STD) testing. Diabetes screening. This is done by checking your blood sugar (glucose) after you have not eaten for a while (fasting). You may have this done every 1-3 years. Bone density scan. This is done to screen for osteoporosis. You may have this done starting at age 41. Mammogram. This may be done every 1-2 years. Talk to your health  care provider about how often you should have regular mammograms. Talk with your health care provider about your test results, treatment options, and if necessary, the need for more tests. Vaccines  Your health care provider may recommend certain vaccines, such as: Influenza vaccine. This is recommended every year. Tetanus, diphtheria, and acellular pertussis (Tdap, Td) vaccine. You may need a Td  booster every 10 years. Zoster vaccine. You may need this after age 9. Pneumococcal 13-valent conjugate (PCV13) vaccine. One dose is recommended after age 35. Pneumococcal polysaccharide (PPSV23) vaccine. One dose is recommended after age 80. Talk to your health care provider about which screenings and vaccines you need and how often you need them. This information is not intended to replace advice given to you by your health care provider. Make sure you discuss any questions you have with your health care provider. Document Released: 10/23/2015 Document Revised: 06/15/2016 Document Reviewed: 07/28/2015 Elsevier Interactive Patient Education  2017 [place] Prevention in the Home Falls can cause injuries. They can happen to people of all ages. There are many things you can do to make your home safe and to help prevent falls. What can I do on the outside of my home? Regularly fix the edges of walkways and driveways and fix any cracks. Remove anything that might make you trip as you walk through a door, such as a raised step or threshold. Trim any bushes or trees on the path to your home. Use bright outdoor lighting. Clear any walking paths of anything that might make someone trip, such as rocks or tools. Regularly check to see if handrails are loose or broken. Make sure that both sides of any steps have handrails. Any raised decks and porches should have guardrails on the edges. Have any leaves, snow, or ice cleared regularly. Use sand or salt on walking paths during winter. Clean up any spills in your garage right away. This includes oil or grease spills. What can I do in the bathroom? Use night lights. Install grab bars by the toilet and in the tub and shower. Do not use towel bars as grab bars. Use non-skid mats or decals in the tub or shower. If you need to sit down in the shower, use a plastic, non-slip stool. Keep the floor dry. Clean up any water that spills on the floor  as soon as it happens. Remove soap buildup in the tub or shower regularly. Attach bath mats securely with double-sided non-slip rug tape. Do not have throw rugs and other things on the floor that can make you trip. What can I do in the bedroom? Use night lights. Make sure that you have a light by your bed that is easy to reach. Do not use any sheets or blankets that are too big for your bed. They should not hang down onto the floor. Have a firm chair that has side arms. You can use this for support while you get dressed. Do not have throw rugs and other things on the floor that can make you trip. What can I do in the kitchen? Clean up any spills right away. Avoid walking on wet floors. Keep items that you use a lot in easy-to-reach places. If you need to reach something above you, use a strong step stool that has a grab bar. Keep electrical cords out of the way. Do not use floor polish or wax that makes floors slippery. If you must use wax, use non-skid floor wax. Do not have throw  rugs and other things on the floor that can make you trip. What can I do with my stairs? Do not leave any items on the stairs. Make sure that there are handrails on both sides of the stairs and use them. Fix handrails that are broken or loose. Make sure that handrails are as long as the stairways. Check any carpeting to make sure that it is firmly attached to the stairs. Fix any carpet that is loose or worn. Avoid having throw rugs at the top or bottom of the stairs. If you do have throw rugs, attach them to the floor with carpet tape. Make sure that you have a light switch at the top of the stairs and the bottom of the stairs. If you do not have them, ask someone to add them for you. What else can I do to help prevent falls? Wear shoes that: Do not have high heels. Have rubber bottoms. Are comfortable and fit you well. Are closed at the toe. Do not wear sandals. If you use a stepladder: Make sure that it is  fully opened. Do not climb a closed stepladder. Make sure that both sides of the stepladder are locked into place. Ask someone to hold it for you, if possible. Clearly mark and make sure that you can see: Any grab bars or handrails. First and last steps. Where the edge of each step is. Use tools that help you move around (mobility aids) if they are needed. These include: Canes. Walkers. Scooters. Crutches. Turn on the lights when you go into a dark area. Replace any light bulbs as soon as they burn out. Set up your furniture so you have a clear path. Avoid moving your furniture around. If any of your floors are uneven, fix them. If there are any pets around you, be aware of where they are. Review your medicines with your doctor. Some medicines can make you feel dizzy. This can increase your chance of falling. Ask your doctor what other things that you can do to help prevent falls. This information is not intended to replace advice given to you by your health care provider. Make sure you discuss any questions you have with your health care provider. Document Released: 07/23/2009 Document Revised: 03/03/2016 Document Reviewed: 10/31/2014 Elsevier Interactive Patient Education  2017 [employer].

## 2023-01-29 ENCOUNTER — Telehealth: Payer: Self-pay | Admitting: *Deleted

## 2023-01-29 NOTE — Telephone Encounter (Signed)
$  0 due; PA required (current PA expires on 02/15/23)-Pt due for next Prolia injection on or after 03/07/23.   **Sent to PA team**

## 2023-02-02 ENCOUNTER — Other Ambulatory Visit (INDEPENDENT_AMBULATORY_CARE_PROVIDER_SITE_OTHER): Payer: Medicare HMO

## 2023-02-02 DIAGNOSIS — E559 Vitamin D deficiency, unspecified: Secondary | ICD-10-CM | POA: Diagnosis not present

## 2023-02-02 DIAGNOSIS — E785 Hyperlipidemia, unspecified: Secondary | ICD-10-CM

## 2023-02-02 LAB — VITAMIN D 25 HYDROXY (VIT D DEFICIENCY, FRACTURES): VITD: 29.85 ng/mL — ABNORMAL LOW (ref 30.00–100.00)

## 2023-02-02 LAB — COMPREHENSIVE METABOLIC PANEL
ALT: 12 U/L (ref 0–35)
AST: 22 U/L (ref 0–37)
Albumin: 4 g/dL (ref 3.5–5.2)
Alkaline Phosphatase: 32 U/L — ABNORMAL LOW (ref 39–117)
BUN: 16 mg/dL (ref 6–23)
CO2: 24 mEq/L (ref 19–32)
Calcium: 8.8 mg/dL (ref 8.4–10.5)
Chloride: 105 mEq/L (ref 96–112)
Creatinine, Ser: 0.91 mg/dL (ref 0.40–1.20)
GFR: 58.84 mL/min — ABNORMAL LOW (ref >60.00)
Glucose, Bld: 92 mg/dL (ref 70–99)
Potassium: 4.2 mEq/L (ref 3.5–5.1)
Sodium: 137 mEq/L (ref 135–145)
Total Bilirubin: 0.4 mg/dL (ref 0.2–1.2)
Total Protein: 6.5 g/dL (ref 6.0–8.3)

## 2023-02-08 NOTE — Telephone Encounter (Signed)
Send request for PA-Pt scheduled for Prolia injection on 5/30/243

## 2023-02-14 ENCOUNTER — Ambulatory Visit
Admission: RE | Admit: 2023-02-14 | Discharge: 2023-02-14 | Disposition: A | Discharge status: Home / Self Care | Payer: Medicare HMO | Source: Ambulatory Visit | Attending: Internal Medicine | Admitting: Internal Medicine

## 2023-02-14 DIAGNOSIS — Z1231 Encounter for screening mammogram for malignant neoplasm of breast: Secondary | ICD-10-CM | POA: Diagnosis not present

## 2023-02-14 NOTE — Telephone Encounter (Signed)
Please assist

## 2023-02-15 ENCOUNTER — Other Ambulatory Visit (HOSPITAL_COMMUNITY): Payer: Self-pay

## 2023-02-15 NOTE — Telephone Encounter (Signed)
Patient Advocate Encounter  Prior Authorization for Prolia 60mg  has been approved with [employer].    PA# [id number] Effective dates: 02/09/23 through 02/09/24

## 2023-02-21 NOTE — Telephone Encounter (Signed)
Noted, pt scheduled on 03/09/23

## 2023-03-09 ENCOUNTER — Ambulatory Visit (INDEPENDENT_AMBULATORY_CARE_PROVIDER_SITE_OTHER): Payer: Medicare HMO

## 2023-03-09 DIAGNOSIS — M816 Localized osteoporosis [Lequesne]: Secondary | ICD-10-CM | POA: Diagnosis not present

## 2023-03-09 MED ORDER — DENOSUMAB 60 MG/ML ~~LOC~~ SOSY
60.0000 mg | PREFILLED_SYRINGE | Freq: Once | SUBCUTANEOUS | Status: AC
  Administered 2023-03-09: 60 mg via SUBCUTANEOUS

## 2023-03-09 NOTE — Progress Notes (Signed)
Patient arrived for a Prolia injection and it was administered into her right arm subq. Patient tolerated the injection well and did not show any signs of distress or voice any concerns.

## 2023-04-17 ENCOUNTER — Other Ambulatory Visit: Payer: Self-pay | Admitting: Internal Medicine

## 2023-05-25 ENCOUNTER — Encounter (INDEPENDENT_AMBULATORY_CARE_PROVIDER_SITE_OTHER): Payer: Self-pay

## 2023-06-01 ENCOUNTER — Encounter: Payer: Self-pay | Admitting: Internal Medicine

## 2023-06-27 ENCOUNTER — Telehealth: Payer: Self-pay | Admitting: Internal Medicine

## 2023-06-27 DIAGNOSIS — E785 Hyperlipidemia, unspecified: Secondary | ICD-10-CM

## 2023-06-27 DIAGNOSIS — E039 Hypothyroidism, unspecified: Secondary | ICD-10-CM

## 2023-06-27 DIAGNOSIS — E538 Deficiency of other specified B group vitamins: Secondary | ICD-10-CM

## 2023-06-27 DIAGNOSIS — E559 Vitamin D deficiency, unspecified: Secondary | ICD-10-CM

## 2023-06-27 NOTE — Telephone Encounter (Signed)
Patient need lab orders.

## 2023-06-28 ENCOUNTER — Other Ambulatory Visit: Payer: Self-pay | Admitting: Internal Medicine

## 2023-07-03 ENCOUNTER — Other Ambulatory Visit (INDEPENDENT_AMBULATORY_CARE_PROVIDER_SITE_OTHER): Payer: Medicare HMO

## 2023-07-03 DIAGNOSIS — E538 Deficiency of other specified B group vitamins: Secondary | ICD-10-CM | POA: Diagnosis not present

## 2023-07-03 DIAGNOSIS — E559 Vitamin D deficiency, unspecified: Secondary | ICD-10-CM

## 2023-07-03 DIAGNOSIS — E785 Hyperlipidemia, unspecified: Secondary | ICD-10-CM | POA: Diagnosis not present

## 2023-07-03 DIAGNOSIS — E039 Hypothyroidism, unspecified: Secondary | ICD-10-CM

## 2023-07-03 LAB — COMPREHENSIVE METABOLIC PANEL
ALT: 13 U/L (ref 0–35)
AST: 20 U/L (ref 0–37)
Albumin: 4.2 g/dL (ref 3.5–5.2)
Alkaline Phosphatase: 28 U/L — ABNORMAL LOW (ref 39–117)
BUN: 17 mg/dL (ref 6–23)
CO2: 27 mEq/L (ref 19–32)
Calcium: 9.4 mg/dL (ref 8.4–10.5)
Chloride: 103 mEq/L (ref 96–112)
Creatinine, Ser: 0.97 mg/dL (ref 0.40–1.20)
GFR: 54.34 mL/min — ABNORMAL LOW (ref >60.00)
Glucose, Bld: 95 mg/dL (ref 70–99)
Potassium: 4.1 mEq/L (ref 3.5–5.1)
Sodium: 139 mEq/L (ref 135–145)
Total Bilirubin: 0.6 mg/dL (ref 0.2–1.2)
Total Protein: 6.7 g/dL (ref 6.0–8.3)

## 2023-07-03 LAB — VITAMIN D 25 HYDROXY (VIT D DEFICIENCY, FRACTURES): VITD: 29.45 ng/mL — ABNORMAL LOW (ref 30.00–100.00)

## 2023-07-03 LAB — VITAMIN B12: Vitamin B-12: 779 pg/mL (ref 211–911)

## 2023-07-03 LAB — TSH: TSH: 4.14 u[IU]/mL (ref 0.35–5.50)

## 2023-07-04 LAB — LIPID PANEL W/REFLEX DIRECT LDL
Cholesterol: 184 mg/dL (ref <200)
HDL: 68 mg/dL (ref >=50)
LDL Cholesterol (Calc): 92 mg/dL (calc)
Non-HDL Cholesterol (Calc): 116 mg/dL (calc) (ref <130)
Total CHOL/HDL Ratio: 2.7 (calc) (ref <5.0)
Triglycerides: 138 mg/dL (ref <150)

## 2023-07-10 ENCOUNTER — Encounter: Payer: Self-pay | Admitting: Internal Medicine

## 2023-07-10 ENCOUNTER — Telehealth: Payer: Self-pay

## 2023-07-10 ENCOUNTER — Ambulatory Visit (INDEPENDENT_AMBULATORY_CARE_PROVIDER_SITE_OTHER): Payer: Medicare HMO | Admitting: Internal Medicine

## 2023-07-10 VITALS — BP 118/70 | HR 78 | Temp 98.0°F | Resp 16 | Ht 63.5 in | Wt 157.6 lb

## 2023-07-10 DIAGNOSIS — E039 Hypothyroidism, unspecified: Secondary | ICD-10-CM

## 2023-07-10 DIAGNOSIS — M4726 Other spondylosis with radiculopathy, lumbar region: Secondary | ICD-10-CM | POA: Insufficient documentation

## 2023-07-10 DIAGNOSIS — F5105 Insomnia due to other mental disorder: Secondary | ICD-10-CM

## 2023-07-10 DIAGNOSIS — I700 Atherosclerosis of aorta: Secondary | ICD-10-CM

## 2023-07-10 DIAGNOSIS — Z974 Presence of external hearing-aid: Secondary | ICD-10-CM | POA: Insufficient documentation

## 2023-07-10 DIAGNOSIS — D696 Thrombocytopenia, unspecified: Secondary | ICD-10-CM

## 2023-07-10 DIAGNOSIS — K4090 Unilateral inguinal hernia, without obstruction or gangrene, not specified as recurrent: Secondary | ICD-10-CM

## 2023-07-10 DIAGNOSIS — E785 Hyperlipidemia, unspecified: Secondary | ICD-10-CM

## 2023-07-10 DIAGNOSIS — I708 Atherosclerosis of other arteries: Secondary | ICD-10-CM

## 2023-07-10 DIAGNOSIS — M816 Localized osteoporosis [Lequesne]: Secondary | ICD-10-CM

## 2023-07-10 DIAGNOSIS — F409 Phobic anxiety disorder, unspecified: Secondary | ICD-10-CM

## 2023-07-10 MED ORDER — LEVOTHYROXINE SODIUM 112 MCG PO TABS: 112.0000 ug | ORAL_TABLET | Freq: Every day | ORAL | 1 refills | Status: DC

## 2023-07-10 NOTE — Assessment & Plan Note (Signed)
She is requesting a higher dose given TSH of 4.1 on 100 mcg .  Dose increased to 112 mcg.  Repeat TSH in mid November

## 2023-07-10 NOTE — Assessment & Plan Note (Signed)
Managed with atorvastatin twice weekly   Lab Results  Component Value Date   CHOL 184 07/03/2023   HDL 68 07/03/2023   LDLCALC 92 07/03/2023   LDLDIRECT 76.0 12/26/2022   TRIG 138 07/03/2023   CHOLHDL 2.7 07/03/2023

## 2023-07-10 NOTE — Assessment & Plan Note (Signed)
Evaluated and treated with [facility] with a prednisone taper and   PT exercises being done at home ,  extension exercise demonstrated

## 2023-07-10 NOTE — Patient Instructions (Addendum)
I HAVE Increased your levothyroxine to 112 mcg daily ; PLEASE SCHEDULE A LAP APPOINTMENT FOR A  repeat TSH in mid November   We will repeat your DEXA scan in 2025

## 2023-07-10 NOTE — Progress Notes (Signed)
Subjective:  Patient ID: Michelle Butler, female    DOB: 09/25/1941  Age: 82 y.o. MRN: [id number]  CC: The primary encounter diagnosis was Wears hearing aid. Diagnoses of Localized osteoporosis without current pathological fracture, Hypothyroidism (acquired), Insomnia due to anxiety and fear, Hyperlipidemia with target LDL less than 130, Left inguinal hernia, Atherosclerosis of aortic bifurcation and common iliac arteries (HCC), Other spondylosis with radiculopathy, lumbar region, and Thrombocytopenia (HCC) were also pertinent to this visit.   HPI Ronicia Wuethrich presents for  Chief Complaint  Patient presents with   Medical Management of Chronic Issues    Left sided sciatica :  started in August.  History of scoliosis , recently   diagnosed with lubar spondylosis with radiculopathy by [facility].  Given PT exercises to do at home. Which have helped.  Deferred formal PT.  Patient is inquiring about collagen supplements and whether they build bone  Hypothyroidism  reviewed history ,, prior and curren TSH,  prior doses of levothyroxine.  Feels sluggish ; wants to increase dose from 100 cg   Osteoporosis:  last Prolia dose was in May.  She was seen by Dr Elvera Lennox  no change recommended to therapy .  No history of fractures   Had a COVID infection in August  resolved without antivirals or complications  "I still have a lump n my abdomen"  that is in the area of prior hernia repair.  No change in size or color.  No change with defecation . Reviewed surgery follow up.    Outpatient Medications Prior to Visit  Medication Sig Dispense Refill   Calcium Citrate (CITRACAL PO) Take 1 tablet by mouth daily.     ibuprofen (ADVIL) 200 MG tablet Take 200 mg by mouth daily as needed.     acetaminophen (TYLENOL) 500 MG tablet Take 500 mg by mouth every 6 (six) hours as needed for moderate pain.     atorvastatin (LIPITOR) 20 MG tablet TAKE ONE TABLET BY MOUTH TWO TIMES WEEKLY 24 tablet 3   calcium  carbonate (TUMS - DOSED IN MG ELEMENTAL CALCIUM) 500 MG chewable tablet Chew 1 tablet by mouth as needed for indigestion or heartburn.     Carboxymeth-Glyc-Polysorb PF (REFRESH OPTIVE MEGA-3) 0.5-1-0.5 % SOLN Place 1 drop into both eyes in the morning.     cholecalciferol (VITAMIN D3) 25 MCG (1000 UNIT) tablet Take 1,000 Units by mouth in the morning and at bedtime.     denosumab (PROLIA) 60 MG/ML SOSY injection Inject 60 mg into the skin every 6 (six) months.     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     sertraline (ZOLOFT) 50 MG tablet TAKE 1 AND 1/2 TABLETS BY MOUTH DAILY 135 tablet 1   vitamin B-12 (CYANOCOBALAMIN) 1000 MCG tablet Take 1,000 mcg by mouth daily.     Carboxymethylcellulose Sodium (REFRESH LIQUIGEL OP) Place 1 drop into both eyes at bedtime.     levothyroxine (SYNTHROID) 100 MCG tablet TAKE 1 TABLET BY MOUTH EVERY DAY 90 tablet 1   naproxen sodium (ALEVE) 220 MG tablet Take 220 mg by mouth daily as needed (pain).     Psyllium (METAMUCIL) 48.57 % POWD Take 1 Scoop by mouth daily.     Facility-Administered Medications Prior to Visit  Medication Dose Route Frequency Provider Last Rate Last Admin   denosumab (  PROLIA) injection 60 mg  60 mg Subcutaneous Q6 months Sherlene Shams, MD   60 mg at 09/06/22 1191    Review of Systems;  Patient denies headache, fevers, malaise, unintentional weight loss, skin rash, eye pain, sinus congestion and sinus pain, sore throat, dysphagia,  hemoptysis , cough, dyspnea, wheezing, chest pain, palpitations, orthopnea, edema, abdominal pain, nausea, melena, diarrhea, constipation, flank pain, dysuria, hematuria, urinary  Frequency, nocturia, numbness, tingling, seizures,  Focal weakness, Loss of consciousness,  Tremor, insomnia, depression, anxiety, and suicidal ideation.      Objective:   BP 118/70   Pulse 78   Temp 98 F (36.7 C)   Resp 16   Ht 5' 3.5" (1.613 m)   Wt 157 lb 9.6 oz (71.5 kg)   SpO2 98%   BMI 27.48 kg/m   BP Readings from Last 3 Encounters:  07/10/23 118/70  01/05/23 126/76  12/01/22 122/78    Wt Readings from Last 3 Encounters:  07/10/23 157 lb 9.6 oz (71.5 kg)  01/09/23 155 lb (70.3 kg)  01/05/23 155 lb 6.4 oz (70.5 kg)    Physical Exam Vitals reviewed.  Constitutional:      General: She is not in acute distress.    Appearance: Normal appearance. She is normal weight. She is not ill-appearing, toxic-appearing or diaphoretic.  HENT:     Head: Normocephalic.  Eyes:     General: No scleral icterus.       Right eye: No discharge.        Left eye: No discharge.     Conjunctiva/sclera: Conjunctivae normal.  Cardiovascular:     Rate and Rhythm: Normal rate and regular rhythm.     Heart sounds: Normal heart sounds.  Pulmonary:     Effort: Pulmonary effort is normal. No respiratory distress.     Breath sounds: Normal breath sounds.  Musculoskeletal:        General: Normal range of motion.  Skin:    General: Skin is warm and dry.  Neurological:     General: No focal deficit present.     Mental Status: She is alert and oriented to person, place, and time. Mental status is at baseline.  Psychiatric:        Mood and Affect: Mood normal.        Behavior: Behavior normal.        Thought Content: Thought content normal.        Judgment: Judgment normal.    Lab Results  Component Value Date   HGBA1C 5.5 12/26/2022   HGBA1C 5.5 07/04/2022   HGBA1C 5.6 12/28/2021    Lab Results  Component Value Date   CREATININE 0.97 07/03/2023   CREATININE 0.91 02/02/2023   CREATININE 0.91 12/26/2022    Lab Results  Component Value Date   WBC 4.9 12/26/2022   HGB 13.2 12/26/2022   HCT 39.3 12/26/2022   PLT 141.0 (L) 12/26/2022   GLUCOSE 95 07/03/2023   CHOL 184 07/03/2023   TRIG 138 07/03/2023   HDL 68 07/03/2023   LDLDIRECT 76.0  12/26/2022   LDLCALC 92 07/03/2023   ALT 13 07/03/2023   AST 20 07/03/2023   NA 139 07/03/2023   K 4.1 07/03/2023   CL 103 07/03/2023   CREATININE 0.97 07/03/2023   BUN 17 07/03/2023   CO2 27 07/03/2023   TSH 4.14 07/03/2023   HGBA1C 5.5 12/26/2022    Mammogram 3D SCREEN BREAST BILATERAL  Result Date: 02/15/2023 CLINICAL DATA:  Screening. EXAM: DIGITAL SCREENING BILATERAL  MAMMOGRAM WITH TOMOSYNTHESIS AND CAD TECHNIQUE: Bilateral screening digital craniocaudal and mediolateral oblique mammograms were obtained. Bilateral screening digital breast tomosynthesis was performed. The images were evaluated with computer-aided detection. COMPARISON:  Previous exam(s). ACR Breast Density Category b: There are scattered areas of fibroglandular density. FINDINGS: There are no findings suspicious for malignancy. IMPRESSION: No mammographic evidence of malignancy. A result letter of this screening mammogram will be mailed directly to the patient. RECOMMENDATION: Screening mammogram in one year. (Code:SM-B-01Y) BI-RADS CATEGORY  1: Negative. Electronically Signed   By: Amie Portland M.D.   On: 02/15/2023 11:39    Assessment & Plan:  .Wears hearing aid  Localized osteoporosis without current pathological fracture Assessment & Plan: No change in therapy post endocrinolgoy evaluation. NEXT DOSE OF pRLA IS DUE IN nOVEMBER   Prior trials of alendronate  Were  not tolerated due to pill esophagitis ,  evista caused headaches /migraines.  S Last DEXA was in 2021 at start of therapy and T scores have worsened by repeat evaluation in 2023.     Hypothyroidism (acquired) Assessment & Plan: She is requesting a higher dose given TSH of 4.1 on 100 mcg .  Dose increased to 112 mcg.  Repeat TSH in mid November   Orders: -     TSH; Future  Insomnia due to anxiety and fear Assessment & Plan: MANAGED WITH LORAZE PAM 1/2 DOSE    Hyperlipidemia with target LDL less than 130 Assessment & Plan: MANAGED WITH  ATORVASTATIN 2/WEEK    Left inguinal hernia Assessment & Plan: S/P hernia repair with mesh by [facility] in  2023. With continued patient concern about a painless lump in area of surgery which was evaluated by surgeon in the postoperative period   and felt to be due to weakness of abdominal wall muscles., not  persistent hernia.  Advised to continue monitoring for now without imaging but will image if the "lump" enalrges or becomes symptomatic     Atherosclerosis of aortic bifurcation and common iliac arteries [facility]) Assessment & Plan: Managed with atorvastatin twice weekly   Lab Results  Component Value Date   CHOL 184 07/03/2023   HDL 68 07/03/2023   LDLCALC 92 07/03/2023   LDLDIRECT 76.0 12/26/2022   TRIG 138 07/03/2023   CHOLHDL 2.7 07/03/2023      Other spondylosis with radiculopathy, lumbar region Assessment & Plan: Evaluated and treated with [facility] with a prednisone taper and   PT exercises being done at home ,  extension exercise demonstrated    Thrombocytopenia [facility]) Assessment & Plan: Stable,  No workup needed unless platelets fall below 50K  Lab Results  Component Value Date   WBC 4.9 12/26/2022   HGB 13.2 12/26/2022   HCT 39.3 12/26/2022   MCV 93.3 12/26/2022   PLT 141.0 (L) 12/26/2022     Orders: -     CBC with Differential/Platelet; Future  Other orders -     Levothyroxine Sodium; Take 1 tablet (112 mcg total) by mouth daily.  Dispense: 90 tablet; Refill: 1     I provided  40 minutes of face-to-face time during this encounter reviewing patient's last visit with me, patient's  most recent visit with Orthopedics and Endocrinology,  previous  surgical and non surgical procedures, previous  labs and imaging studies, counseling on currently addressed issues,  and post visit ordering to diagnostics and therapeutics .   Follow-up: Return in about 6 months (around 01/07/2024).   Sherlene Shams, MD

## 2023-07-10 NOTE — Assessment & Plan Note (Addendum)
S/P hernia repair with mesh by [facility] in  2023. With continued patient concern about a painless lump in area of surgery which was evaluated by surgeon in the postoperative period   and felt to be due to weakness of abdominal wall muscles., not  persistent hernia.  Advised to continue monitoring for now without imaging but will image if the "lump" enalrges or becomes symptomatic

## 2023-07-10 NOTE — Assessment & Plan Note (Signed)
MANAGED WITH ATORVASTATIN 2/WEEK

## 2023-07-10 NOTE — Assessment & Plan Note (Signed)
MANAGED WITH LORAZE PAM 1/2 DOSE

## 2023-07-10 NOTE — Assessment & Plan Note (Signed)
Stable,  No workup needed unless platelets fall below 50K  Lab Results  Component Value Date   WBC 4.9 12/26/2022   HGB 13.2 12/26/2022   HCT 39.3 12/26/2022   MCV 93.3 12/26/2022   PLT 141.0 (L) 12/26/2022

## 2023-07-10 NOTE — Assessment & Plan Note (Addendum)
No change in therapy post endocrinolgoy evaluation. NEXT DOSE OF pRLA IS DUE IN nOVEMBER   Prior trials of alendronate  Were  not tolerated due to pill esophagitis ,  evista caused headaches /migraines.  S Last DEXA was in 2021 at start of therapy and T scores have worsened by repeat evaluation in 2023.

## 2023-07-10 NOTE — Telephone Encounter (Signed)
Patient states at check-out that in addition to her 6-week lab appointment, we need to schedule a non-fasting lab appointment one week prior to her 21-month follow-up.  I did schedule this appointment as she requested, but there was no reference to it in our check-out notes, so we will need to add lab orders as needed.

## 2023-08-02 ENCOUNTER — Telehealth: Payer: Self-pay | Admitting: *Deleted

## 2023-08-02 NOTE — Telephone Encounter (Signed)
$  0 due; PA on fie (good until 02/09/2024)

## 2023-08-07 ENCOUNTER — Encounter: Payer: Self-pay | Admitting: Internal Medicine

## 2023-08-13 IMAGING — MG MM DIGITAL SCREENING BILAT W/ TOMO AND CAD
6 of 10 series · 6 of 30 positions shown · non-contrast
Comparison: Previous exam(s).

CLINICAL DATA: Screening.

EXAM:
DIGITAL SCREENING BILATERAL MAMMOGRAM WITH TOMOSYNTHESIS AND CAD
TECHNIQUE: Bilateral screening digital craniocaudal and mediolateral oblique
mammograms were obtained. Bilateral screening digital breast
tomosynthesis was performed. The images were evaluated with
computer-aided detection.

[L CC synth-2D]
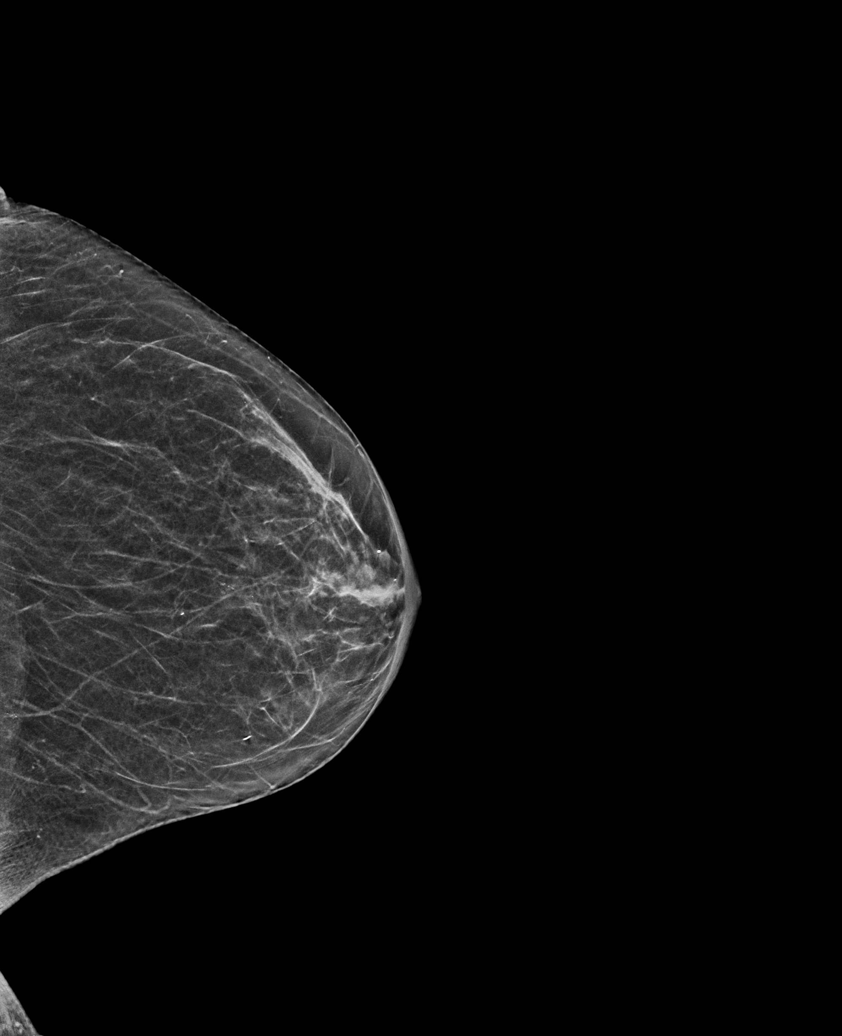

[L MLO synth-2D]
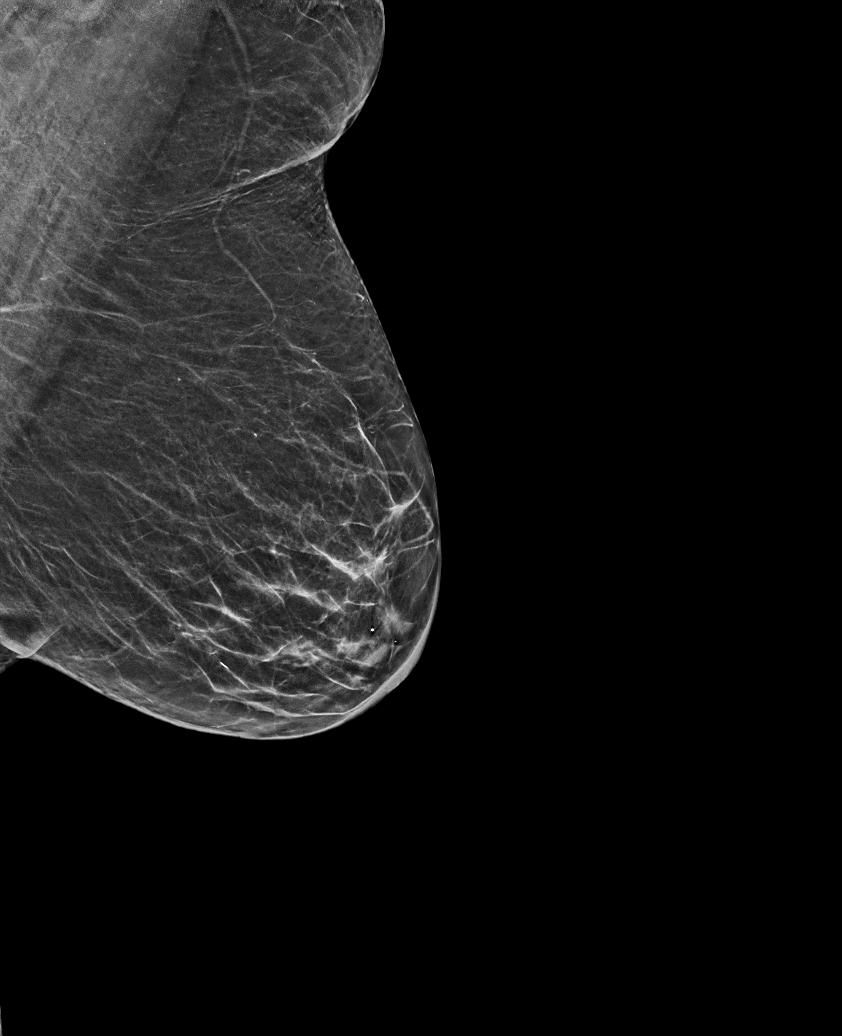

[R MLO synth-2D (1 of 2)]
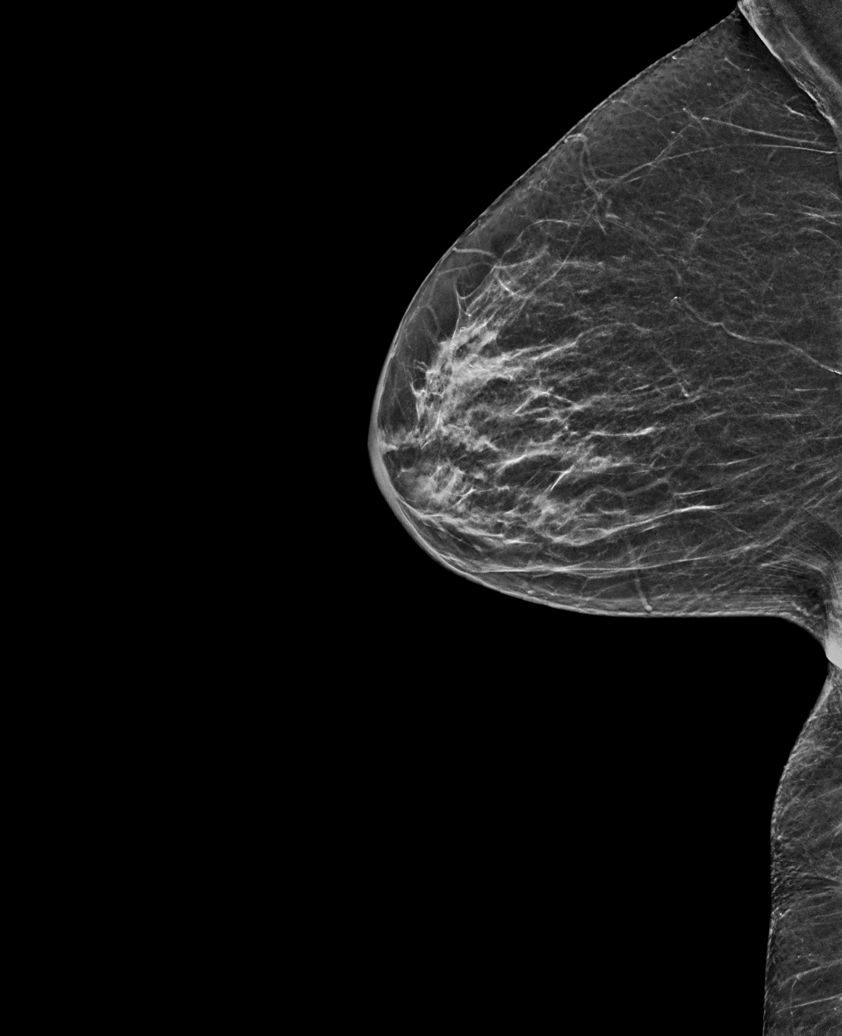

[R MLO synth-2D (2 of 2)]
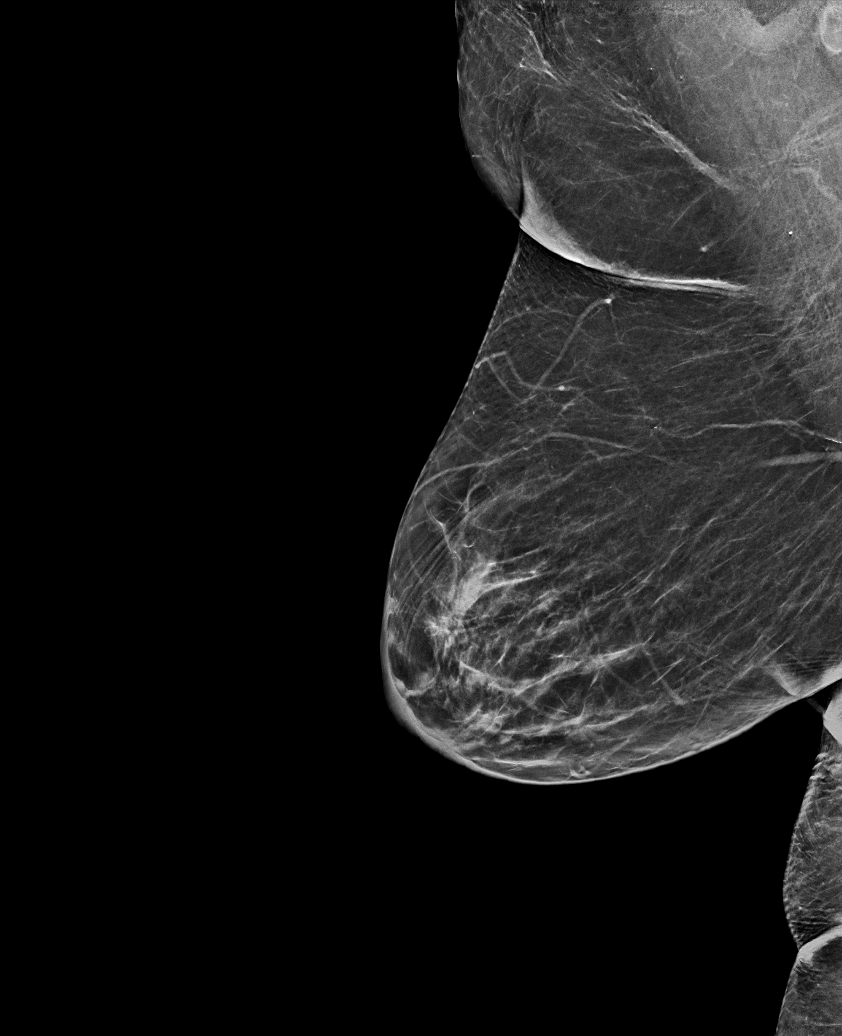

[R CC synth-2D]
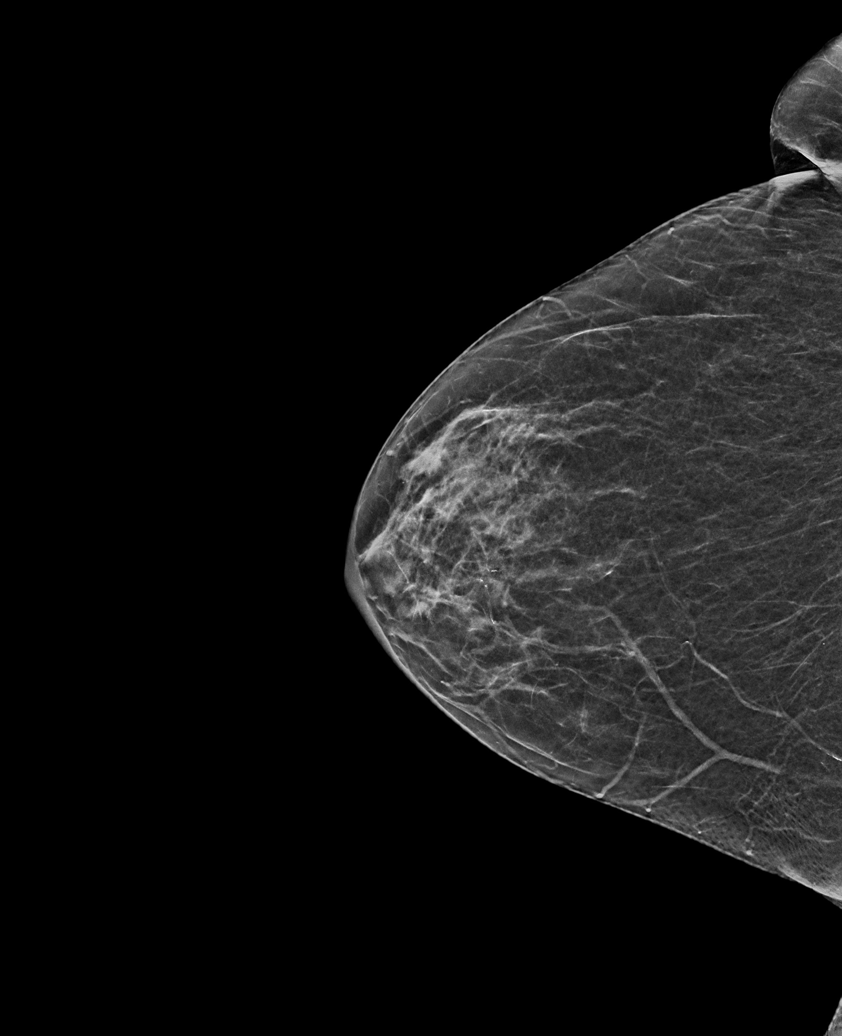

[L MLO tomo · tomo slice 31/60.0]
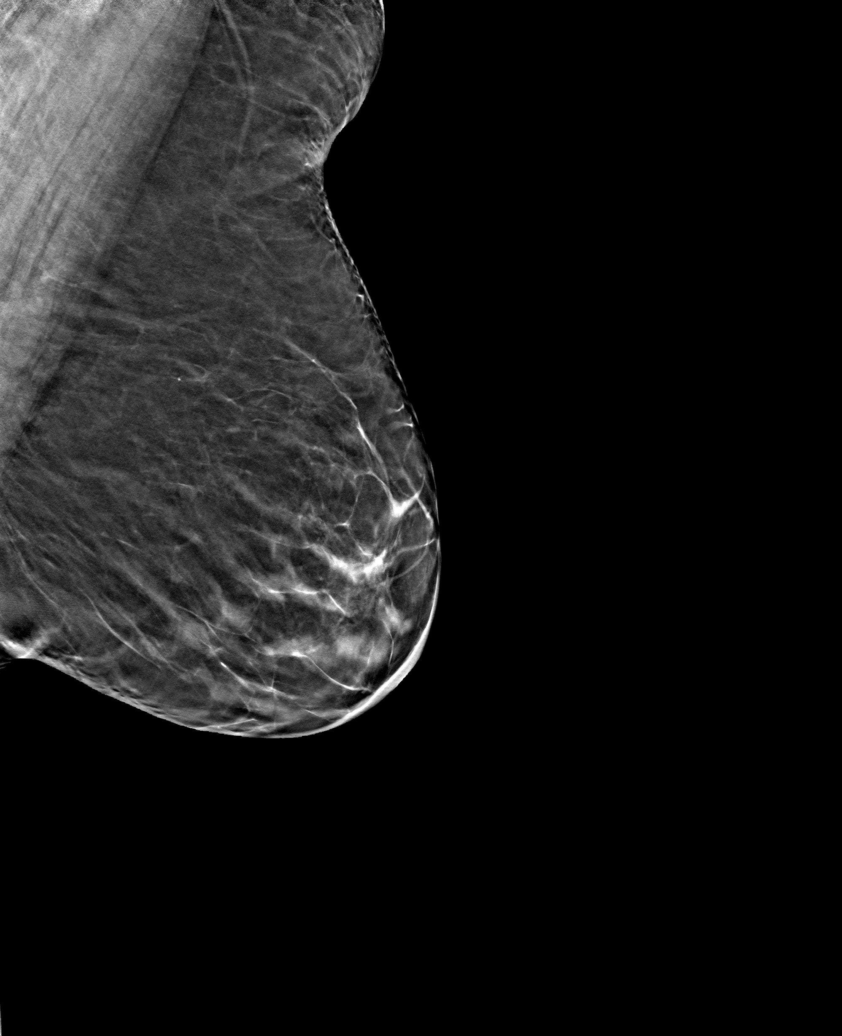

[6 of 30 positions shown; findings below may reference images not displayed]

ACR Breast Density Category b: There are scattered areas of
fibroglandular density.
FINDINGS: There are no findings suspicious for malignancy.
IMPRESSION: No mammographic evidence of malignancy. A result letter of this
screening mammogram will be mailed directly to the patient.

RECOMMENDATION:
Screening mammogram in one year. (Code:51-O-LD2)

BI-RADS CATEGORY  1: Negative.

## 2023-08-21 ENCOUNTER — Other Ambulatory Visit (INDEPENDENT_AMBULATORY_CARE_PROVIDER_SITE_OTHER): Payer: Medicare HMO

## 2023-08-21 DIAGNOSIS — D696 Thrombocytopenia, unspecified: Secondary | ICD-10-CM | POA: Diagnosis not present

## 2023-08-21 DIAGNOSIS — E039 Hypothyroidism, unspecified: Secondary | ICD-10-CM | POA: Diagnosis not present

## 2023-08-21 LAB — CBC WITH DIFFERENTIAL/PLATELET
Basophils Absolute: 0 10*3/uL (ref 0.0–0.1)
Basophils Relative: 0.8 % (ref 0.0–3.0)
Eosinophils Absolute: 0.1 10*3/uL (ref 0.0–0.7)
Eosinophils Relative: 0.9 % (ref 0.0–5.0)
HCT: 40.7 % (ref 36.0–46.0)
Hemoglobin: 13.7 g/dL (ref 12.0–15.0)
Lymphocytes Relative: 27.8 % (ref 12.0–46.0)
Lymphs Abs: 1.5 10*3/uL (ref 0.7–4.0)
MCHC: 33.7 g/dL (ref 30.0–36.0)
MCV: 95.7 fL (ref 78.0–100.0)
Monocytes Absolute: 0.8 10*3/uL (ref 0.1–1.0)
Monocytes Relative: 14.4 % — ABNORMAL HIGH (ref 3.0–12.0)
Neutro Abs: 3 10*3/uL (ref 1.4–7.7)
Neutrophils Relative %: 56.1 % (ref 43.0–77.0)
Platelets: 149 10*3/uL — ABNORMAL LOW (ref 150.0–400.0)
RBC: 4.26 Mil/uL (ref 3.87–5.11)
RDW: 13.8 % (ref 11.5–15.5)
WBC: 5.4 10*3/uL (ref 4.0–10.5)

## 2023-08-21 LAB — TSH: TSH: 0.41 u[IU]/mL (ref 0.35–5.50)

## 2023-08-24 ENCOUNTER — Encounter: Payer: Self-pay | Admitting: Internal Medicine

## 2023-08-24 ENCOUNTER — Telehealth: Payer: Self-pay

## 2023-08-24 NOTE — Telephone Encounter (Signed)
LMTCB. Need to schedule pt a non fasting lab appt in 6 weeks. Lab order has been placed.

## 2023-08-24 NOTE — Assessment & Plan Note (Signed)
thyroid is OVERACTIVE since  we increased your dose to 112 mcg daily dose of  thyroid medication. Please reduce your dose ONE DAY PER WEEK  TO 1/2 TABLET (continue taking full tablet he other 6 days)  and  and have a repeat TSH level after  6 weeks  Lab Results  Component Value Date   TSH 0.41 08/21/2023

## 2023-08-24 NOTE — Addendum Note (Signed)
Addended by: Sherlene Shams on: 08/24/2023 07:40 AM   Modules accepted: Orders

## 2023-08-25 NOTE — Telephone Encounter (Signed)
Patient called back and lab appointment scheduled. 

## 2023-09-11 ENCOUNTER — Ambulatory Visit: Payer: Medicare HMO

## 2023-09-11 DIAGNOSIS — M816 Localized osteoporosis [Lequesne]: Secondary | ICD-10-CM | POA: Diagnosis not present

## 2023-09-11 DIAGNOSIS — M810 Age-related osteoporosis without current pathological fracture: Secondary | ICD-10-CM

## 2023-09-11 MED ORDER — DENOSUMAB 60 MG/ML ~~LOC~~ SOSY
60.0000 mg | PREFILLED_SYRINGE | SUBCUTANEOUS | Status: AC
Start: 2024-03-11 — End: ?
  Administered 2024-03-11 – 2024-09-11 (×2): 60 mg via SUBCUTANEOUS

## 2023-09-11 NOTE — Progress Notes (Signed)
Pt presented for their subcutaneous Prolia injection. Pt was identified through two identifiers. Pt was given the information packets about the Prolia and told to schedule their next injection 6 months out. Pt tolerated the subq injection well in the left arm.  

## 2023-10-06 ENCOUNTER — Other Ambulatory Visit (INDEPENDENT_AMBULATORY_CARE_PROVIDER_SITE_OTHER): Payer: Medicare HMO

## 2023-10-06 DIAGNOSIS — E039 Hypothyroidism, unspecified: Secondary | ICD-10-CM

## 2023-10-06 LAB — TSH: TSH: 0.7 u[IU]/mL (ref 0.35–5.50)

## 2023-10-07 ENCOUNTER — Other Ambulatory Visit: Payer: Self-pay | Admitting: Internal Medicine

## 2023-10-18 ENCOUNTER — Encounter: Payer: Self-pay | Admitting: Internal Medicine

## 2023-10-30 ENCOUNTER — Telehealth: Payer: Self-pay

## 2023-10-30 ENCOUNTER — Other Ambulatory Visit: Payer: Self-pay

## 2023-10-30 DIAGNOSIS — E039 Hypothyroidism, unspecified: Secondary | ICD-10-CM

## 2023-10-30 NOTE — Telephone Encounter (Addendum)
Copied from CRM [id number]. Topic: Appointments - Appointment Scheduling >> Oct 30, 2023  9:44 AM Claudine Mouton wrote: Patient called to schedule February TSH. Patient is scheduled for 11/22/23 TSH lab per Dr. Melina Schools request. Please submit the order to attach.   Patient also reports that she received Covid Shot on 10/24/23 at [facility] on [address] >> Oct 30, 2023  7:82 AM Elmarie Shiley H wrote: Patient called to schedule February TSH. Patient is scheduled for 11/22/23 TSH lab per Dr. Melina Schools request. Please submit the order to attach.   Patient also reports that she received Covid Shot on 10/24/23 at [facility] on [address]  Ordered a TSH future lab and the Covid vaccine was already documented.Marland Kitchen

## 2023-11-22 ENCOUNTER — Other Ambulatory Visit: Payer: Medicare HMO

## 2023-11-22 ENCOUNTER — Other Ambulatory Visit (INDEPENDENT_AMBULATORY_CARE_PROVIDER_SITE_OTHER): Payer: Medicare HMO

## 2023-11-22 ENCOUNTER — Other Ambulatory Visit: Payer: Self-pay | Admitting: Internal Medicine

## 2023-11-22 DIAGNOSIS — E039 Hypothyroidism, unspecified: Secondary | ICD-10-CM | POA: Diagnosis not present

## 2023-11-22 NOTE — Telephone Encounter (Signed)
Refill for 30 days only.  OFFICE VISIT NEEDED prior to any more refills

## 2023-11-22 NOTE — Telephone Encounter (Signed)
Refilled: 01/05/2023 Last OV: 07/10/2023 Next OV: 01/08/2024

## 2023-11-23 ENCOUNTER — Encounter: Payer: Self-pay | Admitting: Internal Medicine

## 2023-11-23 LAB — TSH: TSH: 2.84 u[IU]/mL (ref 0.35–5.50)

## 2023-12-06 ENCOUNTER — Ambulatory Visit (INDEPENDENT_AMBULATORY_CARE_PROVIDER_SITE_OTHER): Payer: Medicare HMO | Admitting: Internal Medicine

## 2023-12-06 ENCOUNTER — Encounter: Payer: Self-pay | Admitting: Internal Medicine

## 2023-12-06 VITALS — BP 120/80 | HR 62 | Ht 63.5 in | Wt 160.6 lb

## 2023-12-06 DIAGNOSIS — M810 Age-related osteoporosis without current pathological fracture: Secondary | ICD-10-CM

## 2023-12-06 DIAGNOSIS — E559 Vitamin D deficiency, unspecified: Secondary | ICD-10-CM

## 2023-12-06 NOTE — Progress Notes (Signed)
 Patient ID: Michelle Butler, female   DOB: 1941-06-09, 83 y.o.   MRN: [id number]  HPI  Michelle Butler is a 83 y.o.-year-old female, referred by her PCP, Dr. Darrick Huntsman, for management of osteoporosis (OP). Her daughter, Michelle Butler, is also my pt. last visit 1 year ago.  She accompanies the patient today.  Interim history: No falls or fractures since last visit. No dizziness, vertigo, vision problems.  Reviewed and addended history: Pt was dx with OP in 2021, but osteopenia ~10 years ago, before she moved to [place] from [place], [place].  I reviewed pt's DXA scans: Date L1-L4 T score FN T score 33% distal Radius Ultra distal radius  09/12/2022 ([facility]) N/a N/a -3.1 (-3.8%) -1.7  05/19/2020 ([facility]) N/a N/a -2.8 -2.7   She had no falls in last 5 mo, but fell frequently before.  No dizziness/vertigo/orthostasis/poor vision.  Fractures: - Closed comminuted L humeral fracture 06/2022 (fell when pulling on pants while standing) - in PT for 4 mo, now exercising at home and at the gym  Previous OP treatments:  - Prolia - started 08/2020 02/2021 08/2021 Skipped 02/2022 for oral surgery (as advised by the oral surgeon) 08/2022 03/09/2023 09/11/2023  She has a h/o vitamin D insufficiency. Reviewed available vit D levels: Lab Results  Component Value Date   VD25OH 29.45 (L) 07/03/2023   VD25OH 29.85 (L) 02/02/2023   VD25OH 31.91 07/07/2022   VD25OH 22.38 (L) 06/30/2021   VD25OH 32.35 12/15/2020   VD25OH 28.80 (L) 10/22/2019   Pt is on: - calcium 1000 mg a day + 1000 units - vitamin D  1000 units  + weight bearing exercises.  Also FAB (flexibility and balance) - 2x a week. She gets 4000 steps a day on ave.   She has a h/o B total hip replacements.  She does not take high vitamin A doses.  Menopause was at 83 y/o.   FH of osteoporosis: father - shoulder fracture, B hip fractures.  No h/o hyper/hypocalcemia or hyperparathyroidism. No h/o kidney stones. Lab Results   Component Value Date   CALCIUM 9.4 07/03/2023   CALCIUM 8.8 02/02/2023   CALCIUM 9.0 12/26/2022   CALCIUM 9.0 07/04/2022   CALCIUM 9.5 06/25/2022   CALCIUM 9.0 12/28/2021   CALCIUM 9.0 06/30/2021   CALCIUM 9.2 12/15/2020   CALCIUM 9.3 06/24/2020   CALCIUM 9.4 10/22/2019   No h/o thyrotoxicosis.  She does have hypothyroidism and is levothyroxine 100 mcg daily.    Reviewed TSH recent levels:  Lab Results  Component Value Date   TSH 2.84 11/22/2023   TSH 0.70 10/06/2023   TSH 0.41 08/21/2023   TSH 4.14 07/03/2023   TSH 1.41 12/26/2022   No h/o CKD. Last BUN/Cr: Lab Results  Component Value Date   BUN 17 07/03/2023   CREATININE 0.97 07/03/2023   Of note, SPEP was slightly abnormal, but UPEP + IFE did not show an M spike.  She has a history of basal cell carcinoma on the nose.  ROS: + See HPI + Hypoacusis  I reviewed pt's medications, allergies, PMH, social hx, family hx, and changes were documented in the history of present illness. Otherwise, unchanged from my initial visit note.  Past Medical History:  Diagnosis Date   Anxiety    Cancer (HCC)    basal cell nose   GERD (gastroesophageal reflux disease)    History of methicillin resistant staphylococcus aureus (MRSA) 2012   Hyperlipidemia    Hypothyroidism    Past Surgical History:  Procedure  Laterality Date   APPENDECTOMY  1959   COLONOSCOPY     COLONOSCOPY WITH PROPOFOL N/A 11/29/2022   Procedure: COLONOSCOPY WITH PROPOFOL;  Surgeon: Toney Reil, MD;  Location: [facility] ENDOSCOPY;  Service: Gastroenterology;  Laterality: N/A;   HERNIA REPAIR  2013   Right side with mesh   INGUINAL HERNIA REPAIR Left 10/15/2021   Procedure: HERNIA REPAIR INGUINAL ADULT;  Surgeon: Earline Mayotte, MD;  Location: [facility] ORS;  Service: General;  Laterality: Left;   INSERTION OF MESH  10/15/2021   Procedure: INSERTION OF MESH;  Surgeon: Earline Mayotte, MD;  Location: [facility] ORS;  Service: General;;   left hip replacement   2011   right hip replacement  2000   TONSILLECTOMY  1959   TUBAL LIGATION  1982   VARICOSE VEIN SURGERY  1991   Social History   Socioeconomic History   Marital status: Married    Spouse name: Not on file   Number of children: 4   Years of education: Not on file   Highest education level: Some college, no degree  Occupational History   Not on file  Tobacco Use   Smoking status: Never   Smokeless tobacco: Never  Vaping Use   Vaping status: Never Used  Substance and Sexual Activity   Alcohol use: Yes    Alcohol/week: 2.0 standard drinks of alcohol    Types: 2 Glasses of wine per week    Comment: wine rare   Drug use: Never   Sexual activity: Not on file  Other Topics Concern   Not on file  Social History Narrative   Married    [place]   Social Drivers of Health   Financial Resource Strain: Low Risk  (07/07/2023)   Overall Financial Resource Strain (CARDIA)    Difficulty of Paying Living Expenses: Not hard at all  Food Insecurity: No Food Insecurity (07/07/2023)   Hunger Vital Sign    Worried About Running Out of Food in the Last Year: Never true    Ran Out of Food in the Last Year: Never true  Transportation Needs: No Transportation Needs (07/07/2023)   PRAPARE - Administrator, Civil Service (Medical): No    Lack of Transportation (Non-Medical): No  Physical Activity: Sufficiently Active (07/07/2023)   Exercise Vital Sign    Days of Exercise per Week: 5 days    Minutes of Exercise per Session: 40 min  Stress: Stress Concern Present (07/07/2023)   [employer] of Occupational Health - Occupational Stress Questionnaire    Feeling of Stress : To some extent  Social Connections: Unknown (07/07/2023)   Social Connection and Isolation Panel [NHANES]    Frequency of Communication with Friends and Family: More than three times a week    Frequency of Social Gatherings with Friends and Family: More than three times a week    Attends Religious  Services: Patient declined    Database administrator or Organizations: Yes    Attends Engineer, structural: More than 4 times per year    Marital Status: Married  Catering manager Violence: Not At Risk (01/09/2023)   Humiliation, Afraid, Rape, and Kick questionnaire    Fear of Current or Ex-Partner: No    Emotionally Abused: No    Physically Abused: No    Sexually Abused: No   Current Outpatient Medications on File Prior to Visit  Medication Sig Dispense Refill   acetaminophen (TYLENOL) 500 MG tablet Take 500 mg by mouth  every 6 (six) hours as needed for moderate pain.     atorvastatin (LIPITOR) 20 MG tablet TAKE ONE TABLET BY MOUTH TWO TIMES WEEKLY 24 tablet 3   calcium carbonate (TUMS - DOSED IN MG ELEMENTAL CALCIUM) 500 MG chewable tablet Chew 1 tablet by mouth as needed for indigestion or heartburn.     Calcium Citrate (CITRACAL PO) Take 1 tablet by mouth daily.     Carboxymeth-Glyc-Polysorb PF (REFRESH OPTIVE MEGA-3) 0.5-1-0.5 % SOLN Place 1 drop into both eyes in the morning.     cholecalciferol (VITAMIN D3) 25 MCG (1000 UNIT) tablet Take 1,000 Units by mouth in the morning and at bedtime.     denosumab (PROLIA) 60 MG/ML SOSY injection Inject 60 mg into the skin every 6 (six) months.     ibuprofen (ADVIL) 200 MG tablet Take 200 mg by mouth daily as needed.     levothyroxine (SYNTHROID) 112 MCG tablet Take 1 tablet (112 mcg total) by mouth daily. 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     sertraline (ZOLOFT) 50 MG tablet TAKE 1 AND 1/2 TABLETS BY MOUTH DAILY 135 tablet 1   vitamin B-12 (CYANOCOBALAMIN) 1000 MCG tablet Take 1,000 mcg by mouth daily.     Current Facility-Administered Medications on File Prior to Visit  Medication Dose Route Frequency Provider Last Rate Last Admin   denosumab (PROLIA)  injection 60 mg  60 mg Subcutaneous Q6 months Sherlene Shams, MD   60 mg at 09/11/23 0829   [START ON 03/11/2024] denosumab (PROLIA) injection 60 mg  60 mg Subcutaneous Q6 months Sherlene Shams, MD       Allergies  Allergen Reactions   Citalopram Nausea Only   Duloxetine Nausea Only   Shellfish Allergy Nausea Only    Mussels  only   Amoxicillin Rash    Rash and GI issues   Family History  Problem Relation Age of Onset   Peripheral Artery Disease Mother 20   Hip fracture Father 7   Osteoporosis Father    Melanoma Brother 55   Melanoma Brother    Stroke Maternal Grandfather 71   Stroke Paternal Grandmother 48   Pneumonia Paternal Grandfather 5   Breast cancer Other 60       Pgaunt    PE: BP 120/80   Pulse 62   Ht 5' 3.5" (1.613 m)   Wt 160 lb 9.6 oz (72.8 kg)   SpO2 98%   BMI 28.00 kg/m  Wt Readings from Last 3 Encounters:  12/06/23 160 lb 9.6 oz (72.8 kg)  07/10/23 157 lb 9.6 oz (71.5 kg)  01/09/23 155 lb (70.3 kg)   Constitutional: Normal weight, in NAD.  + mild kyphosis. Eyes: EOMI, no exophthalmos ENT: no thyromegaly, no cervical lymphadenopathy Cardiovascular: RRR, No MRG Respiratory: CTA B Musculoskeletal: no deformities Skin:  no rashes Neurological: + mild tremor with outstretched hands  Assessment: 1. Osteoporosis  2. Vit D insufficiency  Plan: 1. Osteoporosis -Likely postmenopausal/age-related; she also has family history of osteoporosis -She continues Prolia without side effects: No jaw/hip/thigh pain.  She was undergoing dental work at last visit and had a dental graft which healed well. -We reviewed the latest bone density report and, based on T-scores, she was at an increased risk for fractures.  At the level of the 33%  distal radius, the T-scores appears to be worse, but this difference did not reach statistical significance (-3.8%).  We discussed that this is a site of cortical bone, in which I am the osteoporotic medications have poor  penetrance.  However, her ultra distal radius site contains mostly trabecular bone and this is more significantly impacted by antiosteoporosis medications.  Indeed, at this site, her T-score improved from -2.7 to -1.7, a significant improvement, despite the fact that she skipped 1 Prolia injection before the DXA was obtained.  Her next bone density is due 09/2024.  Dr. Darrick Huntsman usually orders these at [facility]. -Since last visit, she has not missed Prolia injections.  Last 2 were on 03/09/2023 and 09/11/2023.  She gets these in Dr. Melina Schools office, which is just a mile from her house, so more convenient. -She continues 1000 mg calcium from supplements and she is also on vitamin D 2000 IU daily, increased from 1600 in 06/2023 -At last visit we discussed fall precautions with her and her daughter -she wears open toed sandals (slides) and we discussed that ideally she would wear closed toe shoes -I also gave her a handout about weightbearing exercises from National osteoporosis foundation and advised her to do this at least 5 out of 7 days.  As of now, she is doing an exercise class and also walking but I advised her to add weightbearing exercises. -We previously discussed about maintaining a good amount of protein in her diet. The recommended daily protein intake is ~0.8 g per kilogram per day. I advised her to try to aim for this amount, since a diet low in proteins can exacerbate osteoporosis.  She is not smoking or drinking more than 2 alcoholic drinks a day. -I recommended an alkaline diet and explained how to achieve this -For now, I do not feel that we absolutely need a bone anabolic agent so we will continue with Prolia.  She had question about possible side effects, which I answered to the best of my ability -We previously discussed about not postponing doses more than 6 months to avoid bone density loss and increased risk for fractures. - will see pt back in 1 year  2. Vit D insufficiency -She  is on 2000 units vitamin D daily -Latest vitamin D level was slightly low in 06/2023, at 29.45, after which her vitamin D level was increased by 400 units -She plans to have another vitamin D level checked at next visit with PCP next month  Carlus Pavlov, MD PhD [facility] Endocrinology

## 2023-12-06 NOTE — Patient Instructions (Addendum)
 Please continue Prolia for now.  Try to get 1200 mg calcium a day (referentially from the diet).  Please return in 1 year. Exercise for Strong Bones (from [facility]oporosis [facility]) There are two types of exercises that are important for building and maintaining bone density:  weight-bearing and muscle-strengthening exercises. Weight-bearing Exercises These exercises include activities that make you move against gravity while staying upright. Weight-bearing exercises can be high-impact or low-impact. High-impact weight-bearing exercises help build bones and keep them strong. If you have broken a bone due to osteoporosis or are at risk of breaking a bone, you may need to avoid high-impact exercises. If you're not sure, you should check with your healthcare provider. Examples of high-impact weight-bearing exercises are: Dancing Doing high-impact aerobics Hiking Jogging/running Jumping Rope Stair climbing Tennis Low-impact weight-bearing exercises can also help keep bones strong and are a safe alternative if you cannot do high-impact exercises. Examples of low-impact weight-bearing exercises are: Using elliptical training machines Doing low-impact aerobics Using stair-step machines Fast walking on a treadmill or outside Muscle-Strengthening Exercises These exercises include activities where you move your body, a weight or some other resistance against gravity. They are also known as resistance exercises and include: Lifting weights Using elastic exercise bands Using weight machines Lifting your own body weight Functional movements, such as standing and rising up on your toes Yoga and Pilates can also improve strength, balance and flexibility. However, certain positions may not be safe for people with osteoporosis or those at increased risk of broken bones. For example, exercises that have you bend forward may increase the chance of breaking a bone in the spine. A physical therapist  should be able to help you learn which exercises are safe and appropriate for you. Non-Impact Exercises Non-impact exercises can help you to improve balance, posture and how well you move in everyday activities. These exercises can also help to increase muscle strength and decrease the risk of falls and broken bones. Some of these exercises include: Balance exercises that strengthen your legs and test your balance, such as Tai Chi, can decrease your risk of falls. Posture exercises that improve your posture and reduce rounded or "sloping" shoulders can help you decrease the chance of breaking a bone, especially in the spine. Functional exercises that improve how well you move can help you with everyday activities and decrease your chance of falling and breaking a bone. For example, if you have trouble getting up from a chair or climbing stairs, you should do these activities as exercises. A physical therapist can teach you balance, posture and functional exercises. Starting a New Exercise Program If you haven't exercised regularly for a while, check with your healthcare provider before beginning a new exercise program--particularly if you have health problems such as heart disease, diabetes or high blood pressure. If you're at high risk of breaking a bone, you should work with a physical therapist to develop a safe exercise program. Once you have your healthcare provider's approval, start slowly. If you've already broken bones in the spine because of osteoporosis, be very careful to avoid activities that require reaching down, bending forward, rapid twisting motions, heavy lifting and those that increase your chance of a fall. As you get started, your muscles may feel sore for a day or two after you exercise. If soreness lasts longer, you may be working too hard and need to ease up. Exercises should be done in a pain-free range of motion. How Much Exercise Do You Need? Weight-bearing exercises 30  minutes  on most days of the week. Do a 30-minutesession or multiple sessions spread out throughout the day. The benefits to your bones are the same.   Muscle-strengthening exercises Two to three days per week. If you don't have much time for strengthening/resistance training, do small amounts at a time. You can do just one body part each day. For example do arms one day, legs the next and trunk the next. You can also spread these exercises out during your normal day.  Balance, posture and functional exercises Every day or as often as needed. You may want to focus on one area more than the others. If you have fallen or lose your balance, spend time doing balance exercises. If you are getting rounded shoulders, work more on posture exercises. If you have trouble climbing stairs or getting up from the couch, do more functional exercises. You can also perform these exercises at one time or spread them during your day. Work with a phyiscal therapist to learn the right exercises for you.

## 2023-12-11 ENCOUNTER — Other Ambulatory Visit: Payer: Self-pay

## 2023-12-11 ENCOUNTER — Telehealth: Payer: Self-pay

## 2023-12-11 DIAGNOSIS — D649 Anemia, unspecified: Secondary | ICD-10-CM

## 2023-12-11 DIAGNOSIS — E785 Hyperlipidemia, unspecified: Secondary | ICD-10-CM

## 2023-12-11 DIAGNOSIS — E559 Vitamin D deficiency, unspecified: Secondary | ICD-10-CM

## 2023-12-11 DIAGNOSIS — R7301 Impaired fasting glucose: Secondary | ICD-10-CM

## 2023-12-11 NOTE — Telephone Encounter (Signed)
 Labs ordered for patient

## 2023-12-11 NOTE — Telephone Encounter (Signed)
 Spoke with pt to let her know that she has two lab appts scheduled and only needs one of them. Pt canceled the one for 01/02/2024 and kept the other one.

## 2023-12-11 NOTE — Telephone Encounter (Signed)
 Copied from CRM [id number]. Topic: Clinical - Request for Lab/Test Order >> Dec 11, 2023  9:27 AM Florestine Avers wrote: Reason for CRM: Patient called in to schedule her lab appointment for 01/02/2024. Please add physical labs for patient to have drawn upon arrival. Patient is also requesting a vitamin D be added as well because Dr. Elvera Lennox wanted her to have one.

## 2023-12-20 ENCOUNTER — Other Ambulatory Visit: Payer: Self-pay | Admitting: Internal Medicine

## 2023-12-27 NOTE — Telephone Encounter (Signed)
 Labs have been ordered

## 2024-01-01 ENCOUNTER — Other Ambulatory Visit (INDEPENDENT_AMBULATORY_CARE_PROVIDER_SITE_OTHER): Payer: Medicare HMO

## 2024-01-01 DIAGNOSIS — E785 Hyperlipidemia, unspecified: Secondary | ICD-10-CM

## 2024-01-01 DIAGNOSIS — R7301 Impaired fasting glucose: Secondary | ICD-10-CM | POA: Diagnosis not present

## 2024-01-01 DIAGNOSIS — D649 Anemia, unspecified: Secondary | ICD-10-CM

## 2024-01-01 DIAGNOSIS — E559 Vitamin D deficiency, unspecified: Secondary | ICD-10-CM

## 2024-01-01 LAB — COMPREHENSIVE METABOLIC PANEL
ALT: 14 U/L (ref 0–35)
AST: 17 U/L (ref 0–37)
Albumin: 4.3 g/dL (ref 3.5–5.2)
Alkaline Phosphatase: 34 U/L — ABNORMAL LOW (ref 39–117)
BUN: 21 mg/dL (ref 6–23)
CO2: 24 meq/L (ref 19–32)
Calcium: 9.5 mg/dL (ref 8.4–10.5)
Chloride: 103 meq/L (ref 96–112)
Creatinine, Ser: 0.93 mg/dL (ref 0.40–1.20)
GFR: 56.96 mL/min — ABNORMAL LOW (ref >60.00)
Glucose, Bld: 115 mg/dL — ABNORMAL HIGH (ref 70–99)
Potassium: 4 meq/L (ref 3.5–5.1)
Sodium: 136 meq/L (ref 135–145)
Total Bilirubin: 0.5 mg/dL (ref 0.2–1.2)
Total Protein: 7.3 g/dL (ref 6.0–8.3)

## 2024-01-01 LAB — VITAMIN D 25 HYDROXY (VIT D DEFICIENCY, FRACTURES): VITD: 36.47 ng/mL (ref 30.00–100.00)

## 2024-01-01 LAB — CBC WITH DIFFERENTIAL/PLATELET
Basophils Absolute: 0 10*3/uL (ref 0.0–0.1)
Basophils Relative: 0.5 % (ref 0.0–3.0)
Eosinophils Absolute: 0 10*3/uL (ref 0.0–0.7)
Eosinophils Relative: 0.2 % (ref 0.0–5.0)
HCT: 40.1 % (ref 36.0–46.0)
Hemoglobin: 13.6 g/dL (ref 12.0–15.0)
Lymphocytes Relative: 21.8 % (ref 12.0–46.0)
Lymphs Abs: 1.8 10*3/uL (ref 0.7–4.0)
MCHC: 33.8 g/dL (ref 30.0–36.0)
MCV: 93.2 fl (ref 78.0–100.0)
Monocytes Absolute: 0.9 10*3/uL (ref 0.1–1.0)
Monocytes Relative: 10.4 % (ref 3.0–12.0)
Neutro Abs: 5.5 10*3/uL (ref 1.4–7.7)
Neutrophils Relative %: 67.1 % (ref 43.0–77.0)
Platelets: 165 10*3/uL (ref 150.0–400.0)
RBC: 4.31 Mil/uL (ref 3.87–5.11)
RDW: 13.9 % (ref 11.5–15.5)
WBC: 8.2 10*3/uL (ref 4.0–10.5)

## 2024-01-01 LAB — LIPID PANEL
Cholesterol: 166 mg/dL (ref 0–200)
HDL: 59.3 mg/dL (ref >39.00)
LDL Cholesterol: 86 mg/dL (ref 0–99)
NonHDL: 106.69
Total CHOL/HDL Ratio: 3
Triglycerides: 102 mg/dL (ref 0.0–149.0)
VLDL: 20.4 mg/dL (ref 0.0–40.0)

## 2024-01-01 LAB — HEMOGLOBIN A1C: Hgb A1c MFr Bld: 5.4 % (ref 4.6–6.5)

## 2024-01-01 LAB — LDL CHOLESTEROL, DIRECT: Direct LDL: 86 mg/dL

## 2024-01-02 ENCOUNTER — Other Ambulatory Visit

## 2024-01-02 ENCOUNTER — Encounter: Payer: Self-pay | Admitting: Internal Medicine

## 2024-01-07 ENCOUNTER — Other Ambulatory Visit: Payer: Self-pay | Admitting: Internal Medicine

## 2024-01-08 ENCOUNTER — Ambulatory Visit: Payer: Medicare HMO | Admitting: Internal Medicine

## 2024-01-09 ENCOUNTER — Ambulatory Visit (INDEPENDENT_AMBULATORY_CARE_PROVIDER_SITE_OTHER): Payer: Medicare HMO | Admitting: Internal Medicine

## 2024-01-09 ENCOUNTER — Encounter: Payer: Self-pay | Admitting: Internal Medicine

## 2024-01-09 VITALS — BP 118/72 | HR 79 | Ht 63.5 in | Wt 157.6 lb

## 2024-01-09 DIAGNOSIS — M4726 Other spondylosis with radiculopathy, lumbar region: Secondary | ICD-10-CM | POA: Diagnosis not present

## 2024-01-09 DIAGNOSIS — M810 Age-related osteoporosis without current pathological fracture: Secondary | ICD-10-CM | POA: Diagnosis not present

## 2024-01-09 MED ORDER — GABAPENTIN 100 MG PO CAPS: 200.0000 mg | ORAL_CAPSULE | Freq: Three times a day (TID) | ORAL | 3 refills | Status: DC

## 2024-01-09 MED ORDER — LEVOTHYROXINE SODIUM 100 MCG PO TABS: 100.0000 ug | ORAL_TABLET | Freq: Every day | ORAL | 3 refills | Status: AC

## 2024-01-09 NOTE — Patient Instructions (Addendum)
 YOU CAN INCREASE THE GABAPENTIN dose  BY 100 MG INCREMENTS IF NEEDED.  YOU CAN ADD 2 DAYTIME DOSES Iof 100 mg  iF YOUR DAYTIME PAIN IS NOT TOLERABLE ON MAX DOSE TYLENOL, and you can increase the evening dose up to 300 mg if needed    You can TAKE 3000 mg of acetominophen (tylenol) every day safely  In divided doses    I have made a REFERRAL TO [facility] FOR RECLAST INFUSIONS   If you are not contacted by phone or Mychart after 2 weeks , PLEASE LET ME KNOW.

## 2024-01-09 NOTE — Progress Notes (Unsigned)
 Subjective:  Patient ID: Michelle Butler, female    DOB: Mar 10, 1941  Age: 83 y.o. MRN: [id number]  CC: There were no encounter diagnoses.   HPI Michelle Butler presents for  Chief Complaint  Patient presents with   Medical Management of Chronic Issues    6 month follow up    1) has been plagued with low back pain for the last month.  [facility], has received oral steroids gabapentin after tylenol and naproxen did not help  MrI scheduled for April 3   2) SAW Gherghe for osteoporosis .  Getting 1200 mg daily   3)  thyroid :  taking 112 6 DAYS PER WEIGHT  Lab Results  Component Value Date   TSH 2.84 11/22/2023   4)SINCE HER LEFT INGUINAL HERNIA REPAIR SHE HAS AN INTERMITTENT BULGE ON THE ABDOMINAL WALL I THE AREA OF THE REPAIR.         Outpatient Medications Prior to Visit  Medication Sig Dispense Refill   acetaminophen (TYLENOL) 500 MG tablet Take 500 mg by mouth every 6 (six) hours as needed for moderate pain.     atorvastatin (LIPITOR) 20 MG tablet TAKE ONE TABLET BY MOUTH TWO TIMES WEEKLY 24 tablet 3   Calcium Carb-Cholecalciferol (CALCIUM 600+D3 PO) Take 1 tablet by mouth daily. Calcium 600 mg - D3 500 mg     Carboxymeth-Glyc-Polysorb PF (REFRESH OPTIVE MEGA-3) 0.5-1-0.5 % SOLN Place 1 drop into both eyes in the morning.     cholecalciferol (VITAMIN D3) 25 MCG (1000 UNIT) tablet Take 1,000 Units by mouth in the morning and at bedtime.     denosumab (PROLIA) 60 MG/ML SOSY injection Inject 60 mg into the skin every 6 (six) months.     gabapentin (NEURONTIN) 100 MG capsule Take 200 mg by mouth at bedtime.     ibuprofen (ADVIL) 200 MG tablet Take 200 mg by mouth daily as needed.     levothyroxine (SYNTHROID) 112 MCG tablet TAKE 1 TABLET BY MOUTH EVERY DAY 90 tablet 1   LORazepam (ATIVAN) 1 MG tablet TAKE 1 TABLET BY MOUTH AT BEDTIME AS NEEDED FOR ANXIETY 30 tablet 0   methylPREDNISolone (MEDROL DOSEPAK) 4 MG TBPK tablet Take 4 mg by mouth.     metroNIDAZOLE  (METROCREAM) 0.75 % cream Apply 1 application topically daily as needed (rosacea).     sertraline (ZOLOFT) 50 MG tablet TAKE 1 AND 1/2 TABLETS BY MOUTH DAILY 135 tablet 1   vitamin B-12 (CYANOCOBALAMIN) 1000 MCG tablet Take 1,000 mcg by mouth daily. Takes 5 days a week     calcium carbonate (TUMS - DOSED IN MG ELEMENTAL CALCIUM) 500 MG chewable tablet Chew 1 tablet by mouth as needed for indigestion or heartburn.     Calcium Citrate (CITRACAL PO) Take 1 tablet by mouth daily.     Facility-Administered Medications Prior to Visit  Medication Dose Route Frequency Provider Last Rate Last Admin   denosumab (PROLIA) injection 60 mg  60 mg Subcutaneous Q6 months Sherlene Shams, MD   60 mg at 09/11/23 0829   [START ON 03/11/2024] denosumab (PROLIA) injection 60 mg  60 mg Subcutaneous Q6 months Sherlene Shams, MD        Review of Systems;  Patient denies headache, fevers, malaise, unintentional weight loss, skin rash, eye pain, sinus congestion and sinus pain, sore throat, dysphagia,  hemoptysis , cough, dyspnea, wheezing, chest pain, palpitations, orthopnea, edema, abdominal pain, nausea, melena, diarrhea, constipation, flank pain, dysuria, hematuria, urinary  Frequency, nocturia,  numbness, tingling, seizures,  Focal weakness, Loss of consciousness,  Tremor, insomnia, depression, anxiety, and suicidal ideation.      Objective:  BP 118/72   Pulse 79   Ht 5' 3.5" (1.613 m)   Wt 157 lb 9.6 oz (71.5 kg)   SpO2 97%   BMI 27.48 kg/m   BP Readings from Last 3 Encounters:  01/09/24 118/72  12/06/23 120/80  07/10/23 118/70    Wt Readings from Last 3 Encounters:  01/09/24 157 lb 9.6 oz (71.5 kg)  12/06/23 160 lb 9.6 oz (72.8 kg)  07/10/23 157 lb 9.6 oz (71.5 kg)    Physical Exam  Lab Results  Component Value Date   HGBA1C 5.4 01/01/2024   HGBA1C 5.5 12/26/2022   HGBA1C 5.5 07/04/2022    Lab Results  Component Value Date   CREATININE 0.93 01/01/2024   CREATININE 0.97 07/03/2023    CREATININE 0.91 02/02/2023    Lab Results  Component Value Date   WBC 8.2 01/01/2024   HGB 13.6 01/01/2024   HCT 40.1 01/01/2024   PLT 165.0 01/01/2024   GLUCOSE 115 (H) 01/01/2024   CHOL 166 01/01/2024   TRIG 102.0 01/01/2024   HDL 59.30 01/01/2024   LDLDIRECT 86.0 01/01/2024   LDLCALC 86 01/01/2024   ALT 14 01/01/2024   AST 17 01/01/2024   NA 136 01/01/2024   K 4.0 01/01/2024   CL 103 01/01/2024   CREATININE 0.93 01/01/2024   BUN 21 01/01/2024   CO2 24 01/01/2024   TSH 2.84 11/22/2023   HGBA1C 5.4 01/01/2024    Mammogram 3D SCREEN BREAST BILATERAL Result Date: 02/15/2023 CLINICAL DATA:  Screening. EXAM: DIGITAL SCREENING BILATERAL MAMMOGRAM WITH TOMOSYNTHESIS AND CAD TECHNIQUE: Bilateral screening digital craniocaudal and mediolateral oblique mammograms were obtained. Bilateral screening digital breast tomosynthesis was performed. The images were evaluated with computer-aided detection. COMPARISON:  Previous exam(s). ACR Breast Density Category b: There are scattered areas of fibroglandular density. FINDINGS: There are no findings suspicious for malignancy. IMPRESSION: No mammographic evidence of malignancy. A result letter of this screening mammogram will be mailed directly to the patient. RECOMMENDATION: Screening mammogram in one year. (Code:SM-B-01Y) BI-RADS CATEGORY  1: Negative. Electronically Signed   By: Amie Portland M.D.   On: 02/15/2023 11:39    Assessment & Plan:  .There are no diagnoses linked to this encounter.   I spent 34 minutes on the day of this face to face encounter reviewing patient's  most recent visit with cardiology,  nephrology,  and neurology,  prior relevant surgical and non surgical procedures, recent  labs and imaging studies, counseling on weight management,  reviewing the assessment and plan with patient, and post visit ordering and reviewing of  diagnostics and therapeutics with patient  .   Follow-up: No follow-ups on file.   Sherlene Shams,  MD

## 2024-01-09 NOTE — Assessment & Plan Note (Signed)
 She has been taking Prila, last dose was Nov 2024  but would like to change to annual Reclast.  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  Referral to Osteoporosis [facility] a [facility] In progress

## 2024-01-09 NOTE — Assessment & Plan Note (Addendum)
 Evaluated and  under treatment  by  [facility] with no improvement despite prednisone taper .  Some relief with gabapentin   MRI ordered

## 2024-01-14 ENCOUNTER — Encounter: Payer: Self-pay | Admitting: Internal Medicine

## 2024-01-15 ENCOUNTER — Other Ambulatory Visit: Payer: Self-pay | Admitting: Internal Medicine

## 2024-01-15 DIAGNOSIS — M4726 Other spondylosis with radiculopathy, lumbar region: Secondary | ICD-10-CM

## 2024-01-15 MED ORDER — TRAMADOL HCL 50 MG PO TABS: 50.0000 mg | ORAL_TABLET | Freq: Four times a day (QID) | ORAL | 0 refills | Status: AC | PRN

## 2024-01-15 NOTE — Assessment & Plan Note (Signed)
 Tramadol trial approved.

## 2024-02-12 ENCOUNTER — Telehealth: Payer: Self-pay

## 2024-02-12 ENCOUNTER — Encounter: Payer: Self-pay | Admitting: Physician Assistant

## 2024-02-12 ENCOUNTER — Ambulatory Visit (INDEPENDENT_AMBULATORY_CARE_PROVIDER_SITE_OTHER): Admitting: Physician Assistant

## 2024-02-12 ENCOUNTER — Encounter: Payer: Self-pay | Admitting: Internal Medicine

## 2024-02-12 ENCOUNTER — Other Ambulatory Visit: Payer: Self-pay | Admitting: Internal Medicine

## 2024-02-12 DIAGNOSIS — M8000XA Age-related osteoporosis with current pathological fracture, unspecified site, initial encounter for fracture: Secondary | ICD-10-CM | POA: Diagnosis not present

## 2024-02-12 DIAGNOSIS — Z1231 Encounter for screening mammogram for malignant neoplasm of breast: Secondary | ICD-10-CM

## 2024-02-12 NOTE — Telephone Encounter (Signed)
 See my chart message

## 2024-02-12 NOTE — Telephone Encounter (Signed)
 Copied from CRM [id number]. Topic: Appointments - Appointment Scheduling >> Feb 12, 2024 10:41 AM Michelle Butler wrote: Patient/patient representative is calling to schedule an appointment a Prolia  injection. Patient would like to be schedule on March 11, 2024 or after. C:[phone]

## 2024-02-12 NOTE — Progress Notes (Signed)
 Office Visit Note   Patient: Michelle Butler           Date of Birth: 04-17-1941           MRN: [id number] Visit Date: 02/12/2024              Requested by: Thersia Flax, MD [id number] [place],  [place] [address] PCP: Thersia Flax, MD   Assessment & Plan: Visit Diagnoses:  1. Age-related osteoporosis with current pathological fracture, initial encounter     Plan: Patient is a pleasant active 83 year old woman who comes in today to discuss osteoporosis.  She does have a history of osteoporosis and has been on Prolia  for 2 years.  She has tolerated this quite well.  She also does some balance training as well as takes both calcium  and vitamin D  supplements.  She has been followed by Dr.Gereghe.  She has hip replacements and back issues so mostly her DEXA scans rely on distal radius.  Most recently she went from -2.7 to -1.7.  She was questioning and referred back to her primary care and questioning whether Reclast would be a more appropriate drug.  I explained that not usually the best choice in people with decreased kidney function.  She is tolerating it well and not having any side effects.  I continue to encourage her to stay on the Prolia .  She is due for another bone density in December May follow-up with me after that is done  Follow-Up Instructions: After next bone density scan  Orders:  No orders of the defined types were placed in this encounter.  No orders of the defined types were placed in this encounter.     Procedures: No procedures performed   Clinical Data: No additional findings.   Subjective: No chief complaint on file.   HPI pleasant 83 year old woman is referred from her primary care today for management of osteoporosis.  She went through menopause when she was 48 she takes calcium  and vitamin D  supplements.  She is not a smoker or drinker.  She does some walking and does some flexibly balance classes.  She has been on Prolia  for 2 years  and is followed by endocrinology for that.  She was getting her Prolia  injections from her primary care.  She discussed with her primary care possibly changing to Reclast.  She is asking about this today  Review of Systems  All other systems reviewed and are negative.    Objective: Vital Signs: There were no vitals taken for this visit.  Physical Exam Constitutional:      Appearance: Normal appearance.  Pulmonary:     Effort: Pulmonary effort is normal.  Skin:    General: Skin is warm and dry.  Neurological:     General: No focal deficit present.     Mental Status: She is alert and oriented to person, place, and time.  Psychiatric:        Mood and Affect: Mood normal.        Behavior: Behavior normal.       Specialty Comments:  No specialty comments available.  Imaging: No results found.   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) 07/02/2021   Contact dermatitis 01/20/2021   Thrombocytopenia (HCC) 12/19/2020   Overweight (BMI 25.0-29.9) 12/17/2020   Chronic pain of right knee 05/07/2020   Osteoarthritis of both hands 10/26/2019   Encounter for preventive health examination 10/26/2019   Vitamin D  deficiency 10/26/2019   Hyperlipidemia with target LDL less than 130 06/18/2019   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/18/2019   Insomnia due to anxiety and fear 06/18/2019   Status post bilateral total hip replacement 06/18/2019   Osteoporosis 06/18/2019   B12  deficiency 06/18/2019   Varicose veins of both lower extremities with pain 06/18/2019   Hypothyroidism (acquired) 06/18/2019   Past Medical History:  Diagnosis Date   Anxiety    Cancer (HCC)    basal cell nose   GERD (gastroesophageal reflux disease)    History of methicillin resistant staphylococcus aureus (MRSA) 2012   Hyperlipidemia    Hypothyroidism     Family History  Problem Relation Age of Onset   Peripheral Artery Disease Mother 76   Hip fracture Father 84   Osteoporosis Father    Melanoma Brother 42   Melanoma Brother    Stroke Maternal Grandfather 62   Stroke Paternal Grandmother 9   Pneumonia Paternal Grandfather 82   Breast cancer Other 60       Pgaunt    Past Surgical History:  Procedure Laterality Date   APPENDECTOMY  1959   COLONOSCOPY     COLONOSCOPY WITH PROPOFOL  N/A 11/29/2022   Procedure: COLONOSCOPY WITH PROPOFOL ;  Surgeon: Selena Daily, MD;  Location: [facility];  Service: Gastroenterology;  Laterality: N/A;   HERNIA REPAIR  2013   Right side with mesh   INGUINAL HERNIA REPAIR Left 10/15/2021   Procedure: HERNIA REPAIR INGUINAL ADULT;  Surgeon: Marshall Skeeter, MD;  Location: [facility];  Service: General;  Laterality: Left;   INSERTION OF MESH  10/15/2021   Procedure: INSERTION OF MESH;  Surgeon: Marshall Skeeter, MD;  Location: [facility] OR[facility];  Service: General;;   left hip replacement  2011   right hip replacement  2000   TONSILLECTOMY  1959   TUBAL LIGATION  1982   VARICOSE VEIN SURGERY  1991   Social History   Occupational History   Not on file  Tobacco Use   Smoking status: Never   Smokeless tobacco: Never  Vaping Use   Vaping status: Never Used  Substance and Sexual Activity   Alcohol use: Yes    Alcohol/week: 2.0 standard drinks of alcohol    Types: 2 Glasses of wine per week    Comment: wine rare   Drug use: Never   Sexual activity: Not on file

## 2024-02-25 ENCOUNTER — Encounter: Payer: Self-pay | Admitting: Internal Medicine

## 2024-02-27 ENCOUNTER — Ambulatory Visit
Admission: RE | Admit: 2024-02-27 | Discharge: 2024-02-27 | Disposition: A | Discharge status: Home / Self Care | Source: Ambulatory Visit | Attending: Internal Medicine | Admitting: Internal Medicine

## 2024-02-27 DIAGNOSIS — Z1231 Encounter for screening mammogram for malignant neoplasm of breast: Secondary | ICD-10-CM | POA: Diagnosis present

## 2024-02-28 NOTE — Telephone Encounter (Signed)
 Pt due in June, I am aware. Thanks

## 2024-03-05 ENCOUNTER — Telehealth: Payer: Self-pay

## 2024-03-05 ENCOUNTER — Other Ambulatory Visit (HOSPITAL_COMMUNITY): Payer: Self-pay

## 2024-03-05 NOTE — Telephone Encounter (Addendum)
 Pt scheduled for Prolia  on 03/11/24. I can not find her info on the [employer] portal.  Requesting urgent assistance.  Thanks

## 2024-03-05 NOTE — Telephone Encounter (Signed)
 Prolia  VOB initiated via [email or web address]  Next Prolia  inj DUE: 03/11/24   PHARMACY BENEFIT: $367.80

## 2024-03-06 ENCOUNTER — Ambulatory Visit

## 2024-03-11 ENCOUNTER — Ambulatory Visit: Payer: Medicare HMO

## 2024-03-11 ENCOUNTER — Ambulatory Visit

## 2024-03-11 ENCOUNTER — Other Ambulatory Visit (HOSPITAL_COMMUNITY): Payer: Self-pay

## 2024-03-11 DIAGNOSIS — M810 Age-related osteoporosis without current pathological fracture: Secondary | ICD-10-CM | POA: Diagnosis not present

## 2024-03-11 NOTE — Telephone Encounter (Signed)
 Pharmacy Patient Advocate Encounter   Received notification from [employer] Portal that prior authorization for Prolia  is required/requested.   Insurance verification completed.   The patient is insured through [employer] .   Per test claim: PA required; PA submitted to above mentioned insurance via Availity Key/confirmation #/EOC [id number] Status is pending

## 2024-03-11 NOTE — Progress Notes (Signed)
Pt presented for their subcutaneous Prolia injection. Pt was identified through two identifiers. Pt was given the information packets about the Prolia and told to schedule their next injection 6 months out. Pt tolerated the subq injection well in the right arm.  

## 2024-03-11 NOTE — Telephone Encounter (Signed)
 Michelle Butler

## 2024-03-13 NOTE — Telephone Encounter (Signed)
 Pt ready for scheduling for PROLIA  on or after : 03/13/24  Option# 1: Buy/Bill (Office supplied medication)  Out-of-pocket cost due at time of clinic visit: $20  Number of injection/visits approved: 2  Primary: [employer] Prolia  co-insurance: 0% Admin fee co-insurance: 15%  Secondary: --- Prolia  co-insurance:  Admin fee co-insurance:   Medical Benefit Details: Date Benefits were checked: 03/08/24 Deductible: $250 Met of $250 Required/ Coinsurance: 0%/ Admin Fee: 15%  Prior Auth: APPROVED PA# [id number] Expiration Date: 03/11/24-03/11/25   # of doses approved: 2 ----------------------------------------------------------------------- Option# 2- Med Obtained from pharmacy:  Pharmacy benefit: Copay $367.80 (Paid to pharmacy) Admin Fee: 15% (Pay at clinic)  Prior Auth: N/A PA# Expiration Date:   # of doses approved:   If patient wants fill through the pharmacy benefit please send prescription to: [employer], and include estimated need by date in rx notes. Pharmacy will ship medication directly to the office.  Patient NOT eligible for Prolia  Copay Card. Copay Card can make patient's cost as little as $25. Link to apply: [email or web address]://www.[email or web address]/copay  ** This summary of benefits is an estimation of the patient's out-of-pocket cost. Exact cost may very based on individual plan coverage.

## 2024-06-12 ENCOUNTER — Other Ambulatory Visit: Payer: Self-pay | Admitting: Internal Medicine

## 2024-06-27 ENCOUNTER — Telehealth: Payer: Self-pay

## 2024-06-27 DIAGNOSIS — E039 Hypothyroidism, unspecified: Secondary | ICD-10-CM

## 2024-06-27 DIAGNOSIS — E538 Deficiency of other specified B group vitamins: Secondary | ICD-10-CM

## 2024-06-27 DIAGNOSIS — D649 Anemia, unspecified: Secondary | ICD-10-CM

## 2024-06-27 DIAGNOSIS — E785 Hyperlipidemia, unspecified: Secondary | ICD-10-CM

## 2024-06-27 NOTE — Telephone Encounter (Signed)
 Copied from CRM #[id number]. Topic: Clinical - Request for Lab/Test Order >> Jun 27, 2024  9:22 AM Berneda FALCON wrote: Reason for CRM: Pt is requesting orders for her labs for her upcoming physical on 10/1. Can we please place the orders for this and schedule her lab appt?

## 2024-06-27 NOTE — Telephone Encounter (Signed)
 Labs pended for approval

## 2024-06-27 NOTE — Telephone Encounter (Signed)
 Appt made.

## 2024-07-05 ENCOUNTER — Other Ambulatory Visit (INDEPENDENT_AMBULATORY_CARE_PROVIDER_SITE_OTHER)

## 2024-07-05 DIAGNOSIS — E785 Hyperlipidemia, unspecified: Secondary | ICD-10-CM

## 2024-07-05 DIAGNOSIS — D649 Anemia, unspecified: Secondary | ICD-10-CM | POA: Diagnosis not present

## 2024-07-05 DIAGNOSIS — E538 Deficiency of other specified B group vitamins: Secondary | ICD-10-CM

## 2024-07-05 DIAGNOSIS — E039 Hypothyroidism, unspecified: Secondary | ICD-10-CM | POA: Diagnosis not present

## 2024-07-05 LAB — VITAMIN B12: Vitamin B-12: 724 pg/mL (ref 211–911)

## 2024-07-05 LAB — LIPID PANEL
Cholesterol: 142 mg/dL (ref 0–200)
HDL: 57 mg/dL (ref >39.00)
LDL Cholesterol: 70 mg/dL (ref 0–99)
NonHDL: 85.27
Total CHOL/HDL Ratio: 2
Triglycerides: 75 mg/dL (ref 0.0–149.0)
VLDL: 15 mg/dL (ref 0.0–40.0)

## 2024-07-05 LAB — CBC WITH DIFFERENTIAL/PLATELET
Basophils Absolute: 0 K/uL (ref 0.0–0.1)
Basophils Relative: 0.9 % (ref 0.0–3.0)
Eosinophils Absolute: 0.1 K/uL (ref 0.0–0.7)
Eosinophils Relative: 1.5 % (ref 0.0–5.0)
HCT: 38.3 % (ref 36.0–46.0)
Hemoglobin: 12.9 g/dL (ref 12.0–15.0)
Lymphocytes Relative: 32.5 % (ref 12.0–46.0)
Lymphs Abs: 1.5 K/uL (ref 0.7–4.0)
MCHC: 33.8 g/dL (ref 30.0–36.0)
MCV: 92.7 fl (ref 78.0–100.0)
Monocytes Absolute: 0.6 K/uL (ref 0.1–1.0)
Monocytes Relative: 13.1 % — ABNORMAL HIGH (ref 3.0–12.0)
Neutro Abs: 2.4 K/uL (ref 1.4–7.7)
Neutrophils Relative %: 52 % (ref 43.0–77.0)
Platelets: 137 K/uL — ABNORMAL LOW (ref 150.0–400.0)
RBC: 4.13 Mil/uL (ref 3.87–5.11)
RDW: 13.9 % (ref 11.5–15.5)
WBC: 4.7 K/uL (ref 4.0–10.5)

## 2024-07-05 LAB — COMPREHENSIVE METABOLIC PANEL WITH GFR
ALT: 14 U/L (ref 0–35)
AST: 23 U/L (ref 0–37)
Albumin: 4.2 g/dL (ref 3.5–5.2)
Alkaline Phosphatase: 28 U/L — ABNORMAL LOW (ref 39–117)
BUN: 16 mg/dL (ref 6–23)
CO2: 29 meq/L (ref 19–32)
Calcium: 9 mg/dL (ref 8.4–10.5)
Chloride: 106 meq/L (ref 96–112)
Creatinine, Ser: 0.92 mg/dL (ref 0.40–1.20)
GFR: 57.5 mL/min — ABNORMAL LOW (ref >60.00)
Glucose, Bld: 90 mg/dL (ref 70–99)
Potassium: 4.6 meq/L (ref 3.5–5.1)
Sodium: 141 meq/L (ref 135–145)
Total Bilirubin: 0.4 mg/dL (ref 0.2–1.2)
Total Protein: 6.6 g/dL (ref 6.0–8.3)

## 2024-07-05 LAB — TSH: TSH: 1.01 u[IU]/mL (ref 0.35–5.50)

## 2024-07-06 ENCOUNTER — Ambulatory Visit: Payer: Self-pay | Admitting: Internal Medicine

## 2024-07-10 ENCOUNTER — Ambulatory Visit (INDEPENDENT_AMBULATORY_CARE_PROVIDER_SITE_OTHER): Admitting: Internal Medicine

## 2024-07-10 ENCOUNTER — Encounter: Payer: Self-pay | Admitting: Internal Medicine

## 2024-07-10 VITALS — BP 116/68 | HR 73 | Ht 63.5 in | Wt 157.6 lb

## 2024-07-10 DIAGNOSIS — Z8719 Personal history of other diseases of the digestive system: Secondary | ICD-10-CM

## 2024-07-10 DIAGNOSIS — R944 Abnormal results of kidney function studies: Secondary | ICD-10-CM

## 2024-07-10 DIAGNOSIS — D696 Thrombocytopenia, unspecified: Secondary | ICD-10-CM

## 2024-07-10 DIAGNOSIS — F411 Generalized anxiety disorder: Secondary | ICD-10-CM

## 2024-07-10 DIAGNOSIS — F410 Panic disorder [episodic paroxysmal anxiety] without agoraphobia: Secondary | ICD-10-CM

## 2024-07-10 DIAGNOSIS — E039 Hypothyroidism, unspecified: Secondary | ICD-10-CM

## 2024-07-10 DIAGNOSIS — I700 Atherosclerosis of aorta: Secondary | ICD-10-CM

## 2024-07-10 DIAGNOSIS — N1831 Chronic kidney disease, stage 3a: Secondary | ICD-10-CM | POA: Diagnosis not present

## 2024-07-10 DIAGNOSIS — M816 Localized osteoporosis [Lequesne]: Secondary | ICD-10-CM | POA: Diagnosis not present

## 2024-07-10 DIAGNOSIS — E559 Vitamin D deficiency, unspecified: Secondary | ICD-10-CM | POA: Diagnosis not present

## 2024-07-10 DIAGNOSIS — D518 Other vitamin B12 deficiency anemias: Secondary | ICD-10-CM

## 2024-07-10 DIAGNOSIS — I708 Atherosclerosis of other arteries: Secondary | ICD-10-CM

## 2024-07-10 DIAGNOSIS — M4726 Other spondylosis with radiculopathy, lumbar region: Secondary | ICD-10-CM

## 2024-07-10 MED ORDER — LORAZEPAM 1 MG PO TABS: ORAL_TABLET | ORAL | 5 refills | Status: AC

## 2024-07-10 MED ORDER — TRAMADOL HCL 50 MG PO TABS: 50.0000 mg | ORAL_TABLET | Freq: Four times a day (QID) | ORAL | 0 refills | Status: AC | PRN

## 2024-07-10 NOTE — Assessment & Plan Note (Signed)
 Continue Prolia ,  BMD has improved

## 2024-07-10 NOTE — Assessment & Plan Note (Signed)
 Using NSAIDs daily for residual left  shoulder pain .  She was advised to stop naproxen and rtc one month

## 2024-07-10 NOTE — Patient Instructions (Addendum)
 You should avoid Aleve  unless absolutely necessary becuas eof its potential to negatively affect on kidney function   You can take 3000 mg of acetominophen (tylenol ) every day safely  In divided doses (750 mg  every 6 hours  Or 1000 mg every 8 hours.)    If pain is not controlled enough with this,  I have refilled the tramadol  for use   Cholesterol is perfect.  Continue twice weekly atorvastatin   Lab appt in one month to check vitamin D  and platelet count

## 2024-07-10 NOTE — Progress Notes (Unsigned)
 Subjective:  Patient ID: Michelle Butler, female    DOB: 1941-04-28  Age: 83 y.o. MRN: [id number]  CC: There were no encounter diagnoses.   HPI Michelle Butler presents for  Chief Complaint  Patient presents with   Medical Management of Chronic Issues    6 month follow up   1) osteoporosis :  last Prolia  injection was June  2025  .  She had been  referred to osteoporosis clinic for Reclast but ortho clinic in May  advised her to stay on Prolia  given her  histor yof CKD   2) Anxiety/insomnia:  3) Atherosclerosis with HLD:  tolerating atorvastatin   4) CKD stage 3a : GFR has been 58 ml/min since 2024   5) Chronic low   back pain  since March,  lots of car travel over aggravating her pain.  Currently cannot  bend over without discomfort.  Wants another ESI In the l4/5  last one in April by [facility]  which gave relief to 6 weeks    Outpatient Medications Prior to Visit  Medication Sig Dispense Refill   acetaminophen  (TYLENOL ) 500 MG tablet Take 500 mg by mouth every 6 (six) hours as needed for moderate pain.     atorvastatin  (LIPITOR) 20 MG tablet TAKE ONE TABLET BY MOUTH TWO TIMES WEEKLY 24 tablet 3   Biotin 1000 MCG tablet Take 1,000 mcg by mouth in the morning and at bedtime.     Calcium  Carb-Cholecalciferol (CALCIUM  600+D3 PO) Take 1 tablet by mouth daily. Calcium  600 mg - D3 500 mg     Carboxymeth-Glyc-Polysorb PF (REFRESH OPTIVE MEGA-3) 0.5-1-0.5 % SOLN Place 1 drop into both eyes in the morning.     cholecalciferol (VITAMIN D3) 25 MCG (1000 UNIT) tablet Take 1,000 Units by mouth in the morning and at bedtime.     denosumab  (PROLIA ) 60 MG/ML SOSY injection Inject 60 mg into the skin every 6 (six) months.     Ivermectin 1 % CREA Apply topically daily.     levothyroxine  (SYNTHROID ) 100 MCG tablet Take 1 tablet (100 mcg total) by mouth daily. 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     sertraline  (ZOLOFT ) 50 MG tablet TAKE 1 AND 1/2 TABLETS BY MOUTH DAILY 135 tablet 1   vitamin B-12 (CYANOCOBALAMIN ) 1000 MCG tablet Take 1,000 mcg by mouth daily. Takes 5 days a week     gabapentin  (NEURONTIN ) 100 MG capsule Take 2 capsules (200 mg total) by mouth 3 (three) times daily. 180 capsule 3   ibuprofen (ADVIL) 200 MG tablet Take 200 mg by mouth daily as needed.     methylPREDNISolone (MEDROL DOSEPAK) 4 MG TBPK tablet Take 4 mg by mouth.     Facility-Administered Medications Prior to Visit  Medication Dose Route Frequency Provider Last Rate Last Admin   denosumab  (PROLIA ) injection 60 mg  60 mg Subcutaneous Q6 months Charlissa Petros L, MD   60 mg at 09/11/23 9170   denosumab  (PROLIA ) injection 60 mg  60 mg Subcutaneous Q6 months Mell Guia L, MD   60 mg at 03/11/24 1537    Review of Systems;  Patient denies headache, fevers, malaise, unintentional weight loss, skin rash, eye pain, sinus congestion and sinus pain, sore throat, dysphagia,  hemoptysis , cough, dyspnea, wheezing, chest pain, palpitations, orthopnea, edema, abdominal  pain, nausea, melena, diarrhea, constipation, flank pain, dysuria, hematuria, urinary  Frequency, nocturia, numbness, tingling, seizures,  Focal weakness, Loss of consciousness,  Tremor, insomnia, depression, anxiety, and suicidal ideation.      Objective:  BP 116/68   Pulse 73   Ht 5' 3.5 (1.613 m)   Wt 157 lb 9.6 oz (71.5 kg)   SpO2 97%   BMI 27.48 kg/m   BP Readings from Last 3 Encounters:  07/10/24 116/68  01/09/24 118/72  12/06/23 120/80    Wt Readings from Last 3 Encounters:  07/10/24 157 lb 9.6 oz (71.5 kg)  01/09/24 157 lb 9.6 oz (71.5 kg)  12/06/23 160 lb 9.6 oz (72.8 kg)    Physical Exam  Lab Results  Component Value Date   HGBA1C 5.4 01/01/2024   HGBA1C 5.5 12/26/2022   HGBA1C 5.5 07/04/2022    Lab Results  Component Value Date    CREATININE 0.92 07/05/2024   CREATININE 0.93 01/01/2024   CREATININE 0.97 07/03/2023    Lab Results  Component Value Date   WBC 4.7 07/05/2024   HGB 12.9 07/05/2024   HCT 38.3 07/05/2024   PLT 137.0 (L) 07/05/2024   GLUCOSE 90 07/05/2024   CHOL 142 07/05/2024   TRIG 75.0 07/05/2024   HDL 57.00 07/05/2024   LDLDIRECT 86.0 01/01/2024   LDLCALC 70 07/05/2024   ALT 14 07/05/2024   AST 23 07/05/2024   NA 141 07/05/2024   K 4.6 07/05/2024   CL 106 07/05/2024   CREATININE 0.92 07/05/2024   BUN 16 07/05/2024   CO2 29 07/05/2024   TSH 1.01 07/05/2024   HGBA1C 5.4 01/01/2024    MM 3D SCREENING MAMMOGRAM BILATERAL BREAST Result Date: 03/01/2024 CLINICAL DATA:  Screening. EXAM: DIGITAL SCREENING BILATERAL MAMMOGRAM WITH TOMOSYNTHESIS AND CAD TECHNIQUE: Bilateral screening digital craniocaudal and mediolateral oblique mammograms were obtained. Bilateral screening digital breast tomosynthesis was performed. The images were evaluated with computer-aided detection. COMPARISON:  Previous exam(s). ACR Breast Density Category b: There are scattered areas of fibroglandular density. FINDINGS: There are no findings suspicious for malignancy. IMPRESSION: No mammographic evidence of malignancy. A result letter of this screening mammogram will be mailed directly to the patient. RECOMMENDATION: Screening mammogram in one year. (Code:SM-B-01Y) BI-RADS CATEGORY  1: Negative. Electronically Signed   By: Alm Parkins M.D.   On: 03/01/2024 09:53    Assessment & Plan:  .There are no diagnoses linked to this encounter.   I spent 34 minutes on the day of this face to face encounter reviewing patient's  most recent visit with cardiology,  nephrology,  and neurology,  prior relevant surgical and non surgical procedures, recent  labs and imaging studies, counseling on weight management,  reviewing the assessment and plan with patient, and post visit ordering and reviewing of  diagnostics and therapeutics with  patient  .   Follow-up: No follow-ups on file.   Verneita LITTIE Kettering, MD

## 2024-07-11 DIAGNOSIS — Z8719 Personal history of other diseases of the digestive system: Secondary | ICD-10-CM | POA: Insufficient documentation

## 2024-07-11 NOTE — Assessment & Plan Note (Signed)
 Continue tylenol  3000 mg daily in divided doses andprn tramadol .  Advied to avoid NSAIDs.SABRA  ESI as needed

## 2024-07-11 NOTE — Assessment & Plan Note (Signed)
 Stable,  No workup needed unless platelets fall below 50K  Lab Results  Component Value Date   WBC 4.7 07/05/2024   HGB 12.9 07/05/2024   HCT 38.3 07/05/2024   MCV 92.7 07/05/2024   PLT 137.0 (L) 07/05/2024

## 2024-07-11 NOTE — Assessment & Plan Note (Signed)
 Resolved. .  Taking oral b 12  Lab Results  Component Value Date   WBC 4.7 07/05/2024   HGB 12.9 07/05/2024   HCT 38.3 07/05/2024   MCV 92.7 07/05/2024   PLT 137.0 (L) 07/05/2024

## 2024-07-11 NOTE — Assessment & Plan Note (Signed)
 Managed with atorvastatin  twice weekly .  She has no claudication symptoms .  Lab Results  Component Value Date   CHOL 142 07/05/2024   HDL 57.00 07/05/2024   LDLCALC 70 07/05/2024   LDLDIRECT 86.0 01/01/2024   TRIG 75.0 07/05/2024   CHOLHDL 2 07/05/2024

## 2024-07-11 NOTE — Assessment & Plan Note (Addendum)
 thyroid  is optimized on current dose of  100 mcg daily  Lab Results  Component Value Date   TSH 1.01 07/05/2024

## 2024-07-11 NOTE — Assessment & Plan Note (Signed)
 Requesting refill of lorazepam  for prn use .  Continue sertraline 

## 2024-07-11 NOTE — Assessment & Plan Note (Signed)
 Michelle Butler examined and reassured that her bulge is not a recurrent hernia but merely sequestration of sub Q fat

## 2024-08-05 ENCOUNTER — Encounter: Payer: Self-pay | Admitting: Internal Medicine

## 2024-08-05 DIAGNOSIS — Z13820 Encounter for screening for osteoporosis: Secondary | ICD-10-CM

## 2024-08-12 ENCOUNTER — Other Ambulatory Visit (INDEPENDENT_AMBULATORY_CARE_PROVIDER_SITE_OTHER)

## 2024-08-12 ENCOUNTER — Encounter: Payer: Self-pay | Admitting: Radiology

## 2024-08-12 DIAGNOSIS — E559 Vitamin D deficiency, unspecified: Secondary | ICD-10-CM | POA: Diagnosis not present

## 2024-08-12 DIAGNOSIS — D696 Thrombocytopenia, unspecified: Secondary | ICD-10-CM

## 2024-08-12 LAB — CBC WITH DIFFERENTIAL/PLATELET
Basophils Absolute: 0 K/uL (ref 0.0–0.1)
Basophils Relative: 0.8 % (ref 0.0–3.0)
Eosinophils Absolute: 0.1 K/uL (ref 0.0–0.7)
Eosinophils Relative: 1.2 % (ref 0.0–5.0)
HCT: 39.2 % (ref 36.0–46.0)
Hemoglobin: 13.2 g/dL (ref 12.0–15.0)
Lymphocytes Relative: 31.9 % (ref 12.0–46.0)
Lymphs Abs: 1.9 K/uL (ref 0.7–4.0)
MCHC: 33.7 g/dL (ref 30.0–36.0)
MCV: 93.4 fl (ref 78.0–100.0)
Monocytes Absolute: 0.8 K/uL (ref 0.1–1.0)
Monocytes Relative: 13.2 % — ABNORMAL HIGH (ref 3.0–12.0)
Neutro Abs: 3.2 K/uL (ref 1.4–7.7)
Neutrophils Relative %: 52.9 % (ref 43.0–77.0)
Platelets: 142 K/uL — ABNORMAL LOW (ref 150.0–400.0)
RBC: 4.2 Mil/uL (ref 3.87–5.11)
RDW: 14 % (ref 11.5–15.5)
WBC: 6 K/uL (ref 4.0–10.5)

## 2024-08-12 LAB — VITAMIN D 25 HYDROXY (VIT D DEFICIENCY, FRACTURES): VITD: 31.9 ng/mL (ref 30.00–100.00)

## 2024-08-14 ENCOUNTER — Ambulatory Visit: Payer: Self-pay | Admitting: Internal Medicine

## 2024-08-16 NOTE — Telephone Encounter (Signed)
 NOTED PT HAS SEEN MESSAGE

## 2024-09-04 ENCOUNTER — Other Ambulatory Visit: Payer: Self-pay | Admitting: Internal Medicine

## 2024-09-10 ENCOUNTER — Other Ambulatory Visit (HOSPITAL_COMMUNITY): Payer: Self-pay

## 2024-09-10 ENCOUNTER — Telehealth: Payer: Self-pay | Admitting: *Deleted

## 2024-09-10 ENCOUNTER — Telehealth: Payer: Self-pay

## 2024-09-10 MED ORDER — DENOSUMAB 60 MG/ML ~~LOC~~ SOSY: 60.0000 mg | PREFILLED_SYRINGE | SUBCUTANEOUS | Status: AC

## 2024-09-10 NOTE — Telephone Encounter (Signed)
 Prolia VOB initiated via [email or web address]  Next Prolia inj DUE: 11/02/23

## 2024-09-10 NOTE — Telephone Encounter (Signed)
 SABRA

## 2024-09-10 NOTE — Telephone Encounter (Signed)
 Pt ready for scheduling for PROLIA  on or after : 09/11/24  Option# 1: Buy/Bill (Office supplied medication)  Out-of-pocket cost due at time of clinic visit: $0  Number of injection/visits approved: 2  Primary: [employer] Prolia  co-insurance: 0% Admin fee co-insurance: 0%  Secondary: --- Prolia  co-insurance:  Admin fee co-insurance:   Medical Benefit Details: Date Benefits were checked: 09/10/24 Deductible: NO/ Coinsurance: 0%/ Admin Fee: 0%  Prior Auth: APPROVED PA# [id number] Expiration Date: 03/11/24-03/11/25   # of doses approved: 2 ----------------------------------------------------------------------- Option# 2- Med Obtained from pharmacy:  Pharmacy benefit: Copay $360 (Paid to pharmacy) Admin Fee: 0% (Pay at clinic)  Prior Auth: N/A PA# Expiration Date:   # of doses approved:   If patient wants fill through the pharmacy benefit please send prescription to: [facility], and include estimated need by date in rx notes. Pharmacy will ship medication directly to the office.  Patient NOT eligible for Prolia  Copay Card. Copay Card can make patient's cost as little as $25. Link to apply: https://www.amgensupportplus.com/copay  ** This summary of benefits is an estimation of the patient's out-of-pocket cost. Exact cost may very based on individual plan coverage.

## 2024-09-10 NOTE — Telephone Encounter (Signed)
 Pt is scheduled for Prolia  injection on 12/3 @ 8am. We have not received benefit information.

## 2024-09-10 NOTE — Addendum Note (Signed)
 Addended by: BRIEN SHARENE RAMAN on: 09/10/2024 10:54 AM   Modules accepted: Orders

## 2024-09-10 NOTE — Telephone Encounter (Signed)
 Prolia  BIV in separate encounter. Will complete referral once verification is done.

## 2024-09-11 ENCOUNTER — Ambulatory Visit

## 2024-09-11 DIAGNOSIS — M810 Age-related osteoporosis without current pathological fracture: Secondary | ICD-10-CM | POA: Diagnosis not present

## 2024-09-11 MED ORDER — DENOSUMAB 60 MG/ML ~~LOC~~ SOSY: 60.0000 mg | PREFILLED_SYRINGE | SUBCUTANEOUS | Status: AC

## 2024-09-11 NOTE — Progress Notes (Signed)
 Patient was administered a Prolia  injection subcutaneously into her left arm. Patient tolerated the Prolia  injection well.

## 2024-09-13 ENCOUNTER — Encounter: Payer: Self-pay | Admitting: General Surgery

## 2024-09-17 ENCOUNTER — Other Ambulatory Visit

## 2024-09-17 ENCOUNTER — Inpatient Hospital Stay: Admission: RE | Admit: 2024-09-17 | Discharge: 2024-09-17 | Attending: Internal Medicine | Admitting: Internal Medicine

## 2024-09-17 ENCOUNTER — Other Ambulatory Visit: Payer: Self-pay | Admitting: Internal Medicine

## 2024-09-17 DIAGNOSIS — Z13820 Encounter for screening for osteoporosis: Secondary | ICD-10-CM

## 2024-09-19 ENCOUNTER — Ambulatory Visit: Payer: Self-pay | Admitting: Internal Medicine

## 2024-12-03 ENCOUNTER — Ambulatory Visit

## 2025-03-13 ENCOUNTER — Ambulatory Visit
